# Patient Record
Sex: Female | Born: 1960 | Race: White | Hispanic: No | Marital: Married | Smoking: Never smoker
Health system: Southern US, Community
[De-identification: ages and names within clinical notes are randomized; demographics above are authoritative.]

## PROBLEM LIST (undated history)

## (undated) DIAGNOSIS — I472 Ventricular tachycardia, unspecified: Secondary | ICD-10-CM

## (undated) DIAGNOSIS — F3281 Premenstrual dysphoric disorder: Secondary | ICD-10-CM

## (undated) DIAGNOSIS — E785 Hyperlipidemia, unspecified: Secondary | ICD-10-CM

## (undated) DIAGNOSIS — I2510 Atherosclerotic heart disease of native coronary artery without angina pectoris: Secondary | ICD-10-CM

## (undated) DIAGNOSIS — I213 ST elevation (STEMI) myocardial infarction of unspecified site: Secondary | ICD-10-CM

## (undated) DIAGNOSIS — I5032 Chronic diastolic (congestive) heart failure: Secondary | ICD-10-CM

## (undated) HISTORY — DX: ST elevation (STEMI) myocardial infarction of unspecified site: I21.3

## (undated) HISTORY — DX: Ventricular tachycardia, unspecified: I47.20

## (undated) HISTORY — DX: Atherosclerotic heart disease of native coronary artery without angina pectoris: I25.10

## (undated) HISTORY — PX: OTHER SURGICAL HISTORY: SHX169

## (undated) HISTORY — DX: Premenstrual dysphoric disorder: F32.81

## (undated) HISTORY — DX: Chronic diastolic (congestive) heart failure: I50.32

## (undated) HISTORY — DX: Hyperlipidemia, unspecified: E78.5

---

## 1999-09-14 ENCOUNTER — Encounter: Payer: Self-pay | Admitting: Family Medicine

## 1999-11-02 ENCOUNTER — Other Ambulatory Visit: Admission: RE | Admit: 1999-11-02 | Discharge: 1999-11-02 | Payer: Self-pay | Admitting: Family Medicine

## 2000-12-15 ENCOUNTER — Other Ambulatory Visit: Admission: RE | Admit: 2000-12-15 | Discharge: 2000-12-15 | Payer: Self-pay | Admitting: Family Medicine

## 2002-02-09 ENCOUNTER — Other Ambulatory Visit: Admission: RE | Admit: 2002-02-09 | Discharge: 2002-02-09 | Payer: Self-pay | Admitting: Internal Medicine

## 2003-04-19 ENCOUNTER — Other Ambulatory Visit: Admission: RE | Admit: 2003-04-19 | Discharge: 2003-04-19 | Payer: Self-pay | Admitting: Family Medicine

## 2004-06-11 ENCOUNTER — Other Ambulatory Visit: Admission: RE | Admit: 2004-06-11 | Discharge: 2004-06-11 | Payer: Self-pay | Admitting: Family Medicine

## 2005-08-09 ENCOUNTER — Ambulatory Visit: Payer: Self-pay | Admitting: Family Medicine

## 2005-08-09 ENCOUNTER — Other Ambulatory Visit: Admission: RE | Admit: 2005-08-09 | Discharge: 2005-08-09 | Payer: Self-pay | Admitting: Family Medicine

## 2005-08-09 ENCOUNTER — Encounter: Payer: Self-pay | Admitting: Family Medicine

## 2005-08-12 ENCOUNTER — Ambulatory Visit: Payer: Self-pay | Admitting: Family Medicine

## 2006-12-15 ENCOUNTER — Ambulatory Visit: Payer: Self-pay | Admitting: Family Medicine

## 2006-12-15 ENCOUNTER — Other Ambulatory Visit: Admission: RE | Admit: 2006-12-15 | Discharge: 2006-12-15 | Payer: Self-pay | Admitting: Family Medicine

## 2006-12-15 ENCOUNTER — Encounter: Payer: Self-pay | Admitting: Family Medicine

## 2008-07-24 ENCOUNTER — Encounter: Payer: Self-pay | Admitting: Family Medicine

## 2008-11-13 ENCOUNTER — Encounter: Payer: Self-pay | Admitting: Family Medicine

## 2008-11-13 ENCOUNTER — Ambulatory Visit: Payer: Self-pay | Admitting: Family Medicine

## 2008-11-13 ENCOUNTER — Other Ambulatory Visit: Admission: RE | Admit: 2008-11-13 | Discharge: 2008-11-13 | Payer: Self-pay | Admitting: Family Medicine

## 2008-11-13 DIAGNOSIS — E785 Hyperlipidemia, unspecified: Secondary | ICD-10-CM | POA: Insufficient documentation

## 2008-11-13 DIAGNOSIS — Z8639 Personal history of other endocrine, nutritional and metabolic disease: Secondary | ICD-10-CM | POA: Insufficient documentation

## 2008-11-13 DIAGNOSIS — Z862 Personal history of diseases of the blood and blood-forming organs and certain disorders involving the immune mechanism: Secondary | ICD-10-CM | POA: Insufficient documentation

## 2008-11-20 ENCOUNTER — Encounter (INDEPENDENT_AMBULATORY_CARE_PROVIDER_SITE_OTHER): Payer: Self-pay | Admitting: *Deleted

## 2008-11-26 ENCOUNTER — Ambulatory Visit: Payer: Self-pay | Admitting: Family Medicine

## 2008-11-28 LAB — CONVERTED CEMR LAB
ALT: 17 units/L (ref 0–35)
AST: 18 units/L (ref 0–37)
Albumin: 4 g/dL (ref 3.5–5.2)
Alkaline Phosphatase: 43 units/L (ref 39–117)
BUN: 14 mg/dL (ref 6–23)
Basophils Relative: 0.6 % (ref 0.0–3.0)
CO2: 29 meq/L (ref 19–32)
Chloride: 107 meq/L (ref 96–112)
Eosinophils Relative: 2.3 % (ref 0.0–5.0)
Glucose, Bld: 101 mg/dL — ABNORMAL HIGH (ref 70–99)
HDL: 48.4 mg/dL (ref >39.0)
Monocytes Relative: 6.9 % (ref 3.0–12.0)
Neutrophils Relative %: 65.9 % (ref 43.0–77.0)
Platelets: 190 10*3/uL (ref 150–400)
Potassium: 4.2 meq/L (ref 3.5–5.1)
RBC: 3.91 M/uL (ref 3.87–5.11)
Total CHOL/HDL Ratio: 4.4
Total Protein: 6.9 g/dL (ref 6.0–8.3)
Triglycerides: 64 mg/dL (ref 0–149)
WBC: 6.5 10*3/uL (ref 4.5–10.5)

## 2008-12-11 ENCOUNTER — Ambulatory Visit: Payer: Self-pay | Admitting: Family Medicine

## 2008-12-14 ENCOUNTER — Encounter: Payer: Self-pay | Admitting: Family Medicine

## 2009-07-30 ENCOUNTER — Encounter: Payer: Self-pay | Admitting: Family Medicine

## 2009-08-04 ENCOUNTER — Encounter (INDEPENDENT_AMBULATORY_CARE_PROVIDER_SITE_OTHER): Payer: Self-pay | Admitting: *Deleted

## 2009-10-03 ENCOUNTER — Telehealth: Payer: Self-pay | Admitting: Family Medicine

## 2009-11-11 ENCOUNTER — Telehealth: Payer: Self-pay | Admitting: Family Medicine

## 2009-12-24 ENCOUNTER — Ambulatory Visit: Payer: Self-pay | Admitting: Family Medicine

## 2009-12-24 LAB — CONVERTED CEMR LAB
ALT: 21 U/L
AST: 23 U/L
Cholesterol: 191 mg/dL
HDL: 55.5 mg/dL
LDL Cholesterol: 120 mg/dL — ABNORMAL HIGH
Total CHOL/HDL Ratio: 3
Triglycerides: 80 mg/dL
VLDL: 16 mg/dL

## 2009-12-31 ENCOUNTER — Other Ambulatory Visit: Admission: RE | Admit: 2009-12-31 | Discharge: 2009-12-31 | Payer: Self-pay | Admitting: Family Medicine

## 2009-12-31 ENCOUNTER — Ambulatory Visit: Payer: Self-pay | Admitting: Family Medicine

## 2009-12-31 DIAGNOSIS — N943 Premenstrual tension syndrome: Secondary | ICD-10-CM | POA: Insufficient documentation

## 2010-01-07 ENCOUNTER — Encounter (INDEPENDENT_AMBULATORY_CARE_PROVIDER_SITE_OTHER): Payer: Self-pay | Admitting: *Deleted

## 2010-02-18 ENCOUNTER — Telehealth: Payer: Self-pay | Admitting: Family Medicine

## 2010-12-15 NOTE — Letter (Signed)
Summary: Results Follow up Letter  [place] [facility]  [address] [place], [place] [address]   Phone: [phone]  Fax: [phone]    01/07/2010 MRN: [id number]    Leslie Shepard [address] [place], [place]  [address]    Dear Ms. [name],  The following are the results of your recent test(s):  Test         Result    Pap Smear:        Normal __X___  Not Normal _____ Comments: ______________________________________________________ Cholesterol: LDL(Bad cholesterol):         Your goal is less than:         HDL (Good cholesterol):       Your goal is more than: Comments:  ______________________________________________________ Mammogram:        Normal _____  Not Normal _____ Comments:  ___________________________________________________________________ Hemoccult:        Normal _____  Not normal _______ Comments:    _____________________________________________________________________ Other Tests:    We routinely do not discuss normal results over the telephone.  If you desire a copy of the results, or you have any questions about this information we can discuss them at your next office visit.   Sincerely,    Marne A. Milinda Antis, M.D.  MAT:lsf

## 2010-12-15 NOTE — Assessment & Plan Note (Signed)
Summary: PAP SMEAR AND CPX/CLE   Vital Signs:  Patient profile:   50 year old female Height:      64 inches Weight:      145 pounds BMI:     24.98 Temp:     98 degrees F oral Pulse rate:   60 / minute Pulse rhythm:   regular BP sitting:   116 / 70  (left arm) Cuff size:   regular  Vitals Entered By: Lewanda Rife LPN (December 31, 2009 10:42 AM)  History of Present Illness: here for exam and pap  is doing well and feeling good   no concerns or problems   getting ready for spring  wants to get started back with exercise  is interested in zumba   cholesterol is good -- LDL is 120s -- much imp / wants to stay at same dose diet is very good  no side eff is thirsty at night   Td up to date no flu shots   nl mam in fall  self exam- no new lumps or changes  periods are regular -- shorter cycle sometimes 25 days  bad PMDD -- irrational anger  is interested in sarafem       Allergies (verified): No Known Drug Allergies  Past History:  Family History: Last updated: 12/31/2009 Mother- High cholesterol Father- epilepsy M 1/2 Aunt Breast Ca? Grandparents- Depression sister- carotid stenosis (smoker), high chol sister- bulemia sister cholesterol  Social History: Last updated: 11/13/2008 Never Smoked Alcohol use-yes/ occasional Drug use-no exercise- running/ biking   Risk Factors: Smoking Status: never (11/13/2008)  Past Medical History: (2006) Thyroiditis Hyperlipidemia PMDD   endocrine- Dr Talmage Nap (past)  Family History: Mother- High cholesterol Father- epilepsy M 1/2 Aunt Breast Ca? Grandparents- Depression sister- carotid stenosis (smoker), high chol sister- bulemia sister cholesterol  Review of Systems General:  Denies fatigue, fever, loss of appetite, and malaise. Eyes:  Denies blurring and eye irritation. CV:  Denies chest pain or discomfort and lightheadness. Resp:  Denies cough and shortness of breath. GI:  Denies abdominal pain,  bloody stools, change in bowel habits, and indigestion. GU:  Denies abnormal vaginal bleeding, decreased libido, dysuria, and hematuria. MS:  Denies joint pain, joint redness, joint swelling, and muscle aches. Derm:  Denies itching, lesion(s), poor wound healing, and rash. Neuro:  Denies numbness and tingling. Psych:  mood is ok . Endo:  Denies excessive thirst and excessive urination. Heme:  Denies abnormal bruising and bleeding.  Physical Exam  General:  Well-developed,well-nourished,in no acute distress; alert,appropriate and cooperative throughout examination Head:  normocephalic, atraumatic, and no abnormalities observed.   Eyes:  vision grossly intact, pupils equal, pupils round, and pupils reactive to light.   Ears:  R ear normal and L ear normal.   Nose:  no nasal discharge.   Mouth:  pharynx pink and moist.   Neck:  supple with full rom and no masses or thyromegally, no JVD or carotid bruit  Chest Wall:  No deformities, masses, or tenderness noted. Breasts:  No mass, nodules, thickening, tenderness, bulging, retraction, inflamation, nipple discharge or skin changes noted.   Lungs:  Normal respiratory effort, chest expands symmetrically. Lungs are clear to auscultation, no crackles or wheezes. Heart:  Normal rate and regular rhythm. S1 and S2 normal without gallop, murmur, click, rub or other extra sounds. Abdomen:  Bowel sounds positive,abdomen soft and non-tender without masses, organomegaly or hernias noted. no renal bruits  Genitalia:  Normal introitus for age, no external lesions, no vaginal  discharge, mucosa pink and moist, no vaginal or cervical lesions, no vaginal atrophy, no friaility or hemorrhage, normal uterus size and position, no adnexal masses or tenderness Msk:  No deformity or scoliosis noted of thoracic or lumbar spine.  no acute joint changes Pulses:  R and L carotid,radial,femoral,dorsalis pedis and posterior tibial pulses are full and equal  bilaterally Extremities:  No clubbing, cyanosis, edema, or deformity noted with normal full range of motion of all joints.   Neurologic:  sensation intact to light touch, gait normal, and DTRs symmetrical and normal.   Skin:  Intact without suspicious lesions or rashes Cervical Nodes:  No lymphadenopathy noted Axillary Nodes:  No palpable lymphadenopathy Inguinal Nodes:  No significant adenopathy Psych:  normal affect, talkative and pleasant    Impression & Recommendations:  Problem # 1:  HEALTH MAINTENANCE EXAM (ICD-V70.0) Assessment Comment Only reviewed health habits including diet, exercise and skin cancer prevention reviewed health maintenance list and family history lab in 6 mo   Problem # 2:  ROUTINE GYNECOLOGICAL EXAMINATION (ICD-V72.31) Assessment: Comment Only annual exam  trial of sarafem for pmdd- disc poss side eff in detail  Problem # 3:  HYPERLIPIDEMIA (ICD-272.4) Assessment: Improved  impwith zocor and good diet rev lab re check 6 mo  rev low sat fat diet Her updated medication list for this problem includes:    Zocor 10 Mg Tabs (Simvastatin) .Marland Kitchen... 1 by mouth once daily  Labs Reviewed: SGOT: 23 (12/24/2009)   SGPT: 21 (12/24/2009)   HDL:55.50 (12/24/2009), 48.4 (11/26/2008)  LDL:120 (12/24/2009), DEL (11/26/2008)  Chol:191 (12/24/2009), 212 (11/26/2008)  Trig:80.0 (12/24/2009), 64 (11/26/2008)  Problem # 4:  PREMENSTRUAL DYSPHORIC SYNDROME (ICD-625.4) Assessment: New trial of sarafem around time of menses symptoms are disabling and life eff at times  disc pos side eff- if worse anx or dep will stop it and call  Complete Medication List: 1)  Zocor 10 Mg Tabs (Simvastatin) .Marland Kitchen.. 1 by mouth once daily 2)  Sarafem 10 Mg Tabs (Fluoxetine hcl (pmdd)) .... Take 1 by mouth once daily for 10 days around menses  Patient Instructions: 1)  schedule fasting labs in 6 months lipid/ast/alt/tsh/ cbc with diff v70.0, 272  2)  keep working on healthy diet and  exercise 3)  try sarafem for pre menstrual dysphoric syndrome  Prescriptions: ZOCOR 10 MG TABS (SIMVASTATIN) 1 by mouth once daily  #30 x 11   Entered and Authorized by:   Judith Part MD   Signed by:   Judith Part MD on 12/31/2009   Method used:   Print then Give to Patient   RxID:   [id number] SARAFEM 10 MG TABS (FLUOXETINE HCL (PMDD)) take 1 by mouth once daily for 10 days around menses  #10 x 11   Entered and Authorized by:   Judith Part MD   Signed by:   Judith Part MD on 12/31/2009   Method used:   Print then Give to Patient   RxID:   [id number]   Current Allergies (reviewed today): No known allergies    Past Medical History:    (2006) Thyroiditis    Hyperlipidemia    PMDD            endocrine- Dr Talmage Nap (past)

## 2010-12-15 NOTE — Progress Notes (Signed)
Summary: Joint pain  Phone Note Call from Patient Call back at [phone]   Caller: Patient Call For: Judith Part MD Summary of Call: Patient says she started Zocor in November 2010 and now she has developed serve joint pain.  Elbow, hip, and ankles pain.  Legs feel like they are "heavy."  She says she has been doing some reading about Zocor and it does take about joint pain being one of the side effects.  Please advise. Initial call taken by: Linde Gillis CMA Duncan Dull),  February 18, 2010 11:24 AM  Follow-up for Phone Call        stop the zocor  update me by phone in 2 weeks re: how pain is  Follow-up by: Judith Part MD,  February 18, 2010 12:08 PM  Additional Follow-up for Phone Call Additional follow up Details #1::        Left message on voicemail  to return call. Delilah Shan CMA Duncan Dull)  February 18, 2010 2:17 PM   Patient Advised. Lugene Fuquay CMA (AAMA)  February 18, 2010 2:52 PM     New/Updated Medications: ZOCOR 10 MG TABS (SIMVASTATIN) 1 by mouth once daily (holding for joint pain 4/11)

## 2011-04-13 ENCOUNTER — Encounter: Payer: Self-pay | Admitting: Family Medicine

## 2011-04-13 ENCOUNTER — Ambulatory Visit (INDEPENDENT_AMBULATORY_CARE_PROVIDER_SITE_OTHER): Payer: BC Managed Care – PPO | Admitting: Family Medicine

## 2011-04-13 VITALS — BP 100/60 | HR 64 | Temp 98.7°F | Resp 18 | Wt 152.0 lb

## 2011-04-13 DIAGNOSIS — L237 Allergic contact dermatitis due to plants, except food: Secondary | ICD-10-CM

## 2011-04-13 DIAGNOSIS — L255 Unspecified contact dermatitis due to plants, except food: Secondary | ICD-10-CM

## 2011-04-13 MED ORDER — PREDNISONE 10 MG PO TABS: ORAL_TABLET | ORAL | Status: DC

## 2011-04-13 NOTE — Progress Notes (Signed)
Poison ivy.  Exposure Friday.  Itching.  On R arm/hand, B feet, and on B face near the eyes.  No FCNAVD.  Took some zyrtec, minimal change.    Feeling well o/w.  Meds, vitals, and allergies reviewed.   ROS: See HPI.  Otherwise, noncontributory.  ncat Mmm ctab rrr Skin with vesicular lesions on the hands.  Smaller red papules noted on the arms, feet and face.  B rash, irregular distribution, spares the trunk.  No purulent material noted.

## 2011-04-13 NOTE — Patient Instructions (Signed)
Take the prednisone as directed with food.  Let us know if you have other concerns.  I would continue the zyrtec in the meantime.

## 2011-04-14 ENCOUNTER — Encounter: Payer: Self-pay | Admitting: Family Medicine

## 2011-04-14 NOTE — Assessment & Plan Note (Signed)
D/w pt JX:BJYNWGN.  With rash on face and ext x4, I would proceed with steroid taper.  GI/steroid caution and fu prn.  She agrees.

## 2013-02-16 ENCOUNTER — Other Ambulatory Visit (HOSPITAL_COMMUNITY)
Admission: RE | Admit: 2013-02-16 | Discharge: 2013-02-16 | Disposition: A | Discharge status: Home / Self Care | Payer: BC Managed Care – PPO | Source: Ambulatory Visit | Attending: Family Medicine | Admitting: Family Medicine

## 2013-02-16 ENCOUNTER — Ambulatory Visit (INDEPENDENT_AMBULATORY_CARE_PROVIDER_SITE_OTHER): Payer: BC Managed Care – PPO | Admitting: Family Medicine

## 2013-02-16 ENCOUNTER — Encounter: Payer: Self-pay | Admitting: Family Medicine

## 2013-02-16 VITALS — BP 120/70 | HR 56 | Temp 97.8°F | Ht 64.5 in | Wt 157.5 lb

## 2013-02-16 DIAGNOSIS — Z23 Encounter for immunization: Secondary | ICD-10-CM

## 2013-02-16 DIAGNOSIS — Z1151 Encounter for screening for human papillomavirus (HPV): Secondary | ICD-10-CM | POA: Insufficient documentation

## 2013-02-16 DIAGNOSIS — Z01419 Encounter for gynecological examination (general) (routine) without abnormal findings: Secondary | ICD-10-CM | POA: Insufficient documentation

## 2013-02-16 DIAGNOSIS — Z0000 Encounter for general adult medical examination without abnormal findings: Secondary | ICD-10-CM

## 2013-02-16 DIAGNOSIS — E785 Hyperlipidemia, unspecified: Secondary | ICD-10-CM

## 2013-02-16 LAB — COMPREHENSIVE METABOLIC PANEL
AST: 20 U/L (ref 0–37)
Albumin: 3.7 g/dL (ref 3.5–5.2)
Alkaline Phosphatase: 54 U/L (ref 39–117)
BUN: 11 mg/dL (ref 6–23)
Potassium: 3.8 mEq/L (ref 3.5–5.1)
Sodium: 135 mEq/L (ref 135–145)
Total Bilirubin: 0.5 mg/dL (ref 0.3–1.2)
Total Protein: 7 g/dL (ref 6.0–8.3)

## 2013-02-16 LAB — CBC WITH DIFFERENTIAL/PLATELET
Eosinophils Absolute: 0.1 10*3/uL (ref 0.0–0.7)
Lymphs Abs: 2 10*3/uL (ref 0.7–4.0)
MCHC: 33.4 g/dL (ref 30.0–36.0)
MCV: 89.1 fl (ref 78.0–100.0)
Monocytes Absolute: 0.4 10*3/uL (ref 0.1–1.0)
Neutrophils Relative %: 60.6 % (ref 43.0–77.0)
Platelets: 225 10*3/uL (ref 150.0–400.0)
RDW: 13.9 % (ref 11.5–14.6)

## 2013-02-16 LAB — LDL CHOLESTEROL, DIRECT: Direct LDL: 189.3 mg/dL

## 2013-02-16 LAB — LIPID PANEL
Total CHOL/HDL Ratio: 4
Triglycerides: 124 mg/dL (ref 0.0–149.0)

## 2013-02-16 NOTE — Assessment & Plan Note (Addendum)
Small amt of menstrual spotting today Routine exam with pap done Disc expectation for menopause

## 2013-02-16 NOTE — Patient Instructions (Addendum)
When you are ready to schedule a mammogram- I gave you the phone number to schedule Keep doing self breast exams Avoid red meat/ fried foods/ egg yolks/ fatty breakfast meats/ butter, cheese and high fat dairy/ and shellfish   Try to exercise regularly  Pap done today

## 2013-02-16 NOTE — Progress Notes (Signed)
Subjective:    Patient ID: Leslie Shepard, female    DOB: [birth year], 52 y.o.   MRN: [id number]  HPI Here for health maintenance exam and to review chronic medical problems    Wt is up 5 lb today with bmi of 26 Is doing great overall    Needs lab today- will do that   Td 2002  mammo 9/10- she is not ready to get this yet  Self exam  - no lumps or changes   Colon cancer screen- not interested in colonoscopy yet Declines IFOB also   Pap 2/11 normal  Periods are very irregular and hard to predict - at times is heavy  Is spotting today   Flu vaccine -did not get   Hyperlipidemia  Just did not tolerate statin so she stopped it - feels much better    Patient Active Problem List  Diagnosis  . HYPERLIPIDEMIA  . PREMENSTRUAL DYSPHORIC SYNDROME  . THYROIDITIS, HX OF  . Routine general medical examination at a health care facility   Past Medical History  Diagnosis Date  . Thyroiditis 2006    Endocrine-Dr. Talmage Nap (past)  . Hyperlipidemia   . PMDD (premenstrual dysphoric disorder)    No past surgical history on file. History  Substance Use Topics  . Smoking status: Never Smoker   . Smokeless tobacco: Not on file  . Alcohol Use: Yes     Comment: occasional   Family History  Problem Relation Age of Onset  . Hyperlipidemia Mother   . Seizures Father     epilepsy  . Breast cancer Maternal Aunt     M 1/2 Aunt ?  . Depression      grandparents  . Other Sister     carotid stenosis, smoker  . Hyperlipidemia Sister   . Bulemia Sister    No Known Allergies No current outpatient prescriptions on file prior to visit.   No current facility-administered medications on file prior to visit.      Review of Systems Review of Systems  Constitutional: Negative for fever, appetite change, fatigue and unexpected weight change.  Eyes: Negative for pain and visual disturbance.  Respiratory: Negative for cough and shortness of breath.   Cardiovascular: Negative for cp or  palpitations    Gastrointestinal: Negative for nausea, diarrhea and constipation.  Genitourinary: Negative for urgency and frequency. pos for irregular menses  Skin: Negative for pallor or rash   Neurological: Negative for weakness, light-headedness, numbness and headaches.  Hematological: Negative for adenopathy. Does not bruise/bleed easily.  Psychiatric/Behavioral: Negative for dysphoric mood. The patient is not nervous/anxious.         Objective:   Physical Exam  Constitutional: She appears well-developed and well-nourished. No distress.  HENT:  Head: Normocephalic and atraumatic.  Right Ear: External ear normal.  Left Ear: External ear normal.  Nose: Nose normal.  Mouth/Throat: Oropharynx is clear and moist.  Eyes: Conjunctivae and EOM are normal. Right eye exhibits no discharge. Left eye exhibits no discharge. No scleral icterus.  Neck: Normal range of motion. Neck supple. No JVD present. Carotid bruit is not present. No thyromegaly present.  Cardiovascular: Normal rate, regular rhythm, normal heart sounds and intact distal pulses.  Exam reveals no gallop.   Pulmonary/Chest: Effort normal and breath sounds normal. No respiratory distress. She has no wheezes. She has no rales.  Abdominal: Soft. Bowel sounds are normal. She exhibits no distension, no abdominal bruit and no mass. There is no tenderness.  Genitourinary: Rectum normal and uterus  normal. No breast swelling, tenderness, discharge or bleeding. There is no rash, tenderness or lesion on the right labia. There is no rash, tenderness or lesion on the left labia. Uterus is not enlarged and not tender. Cervix exhibits no motion tenderness and no friability. Right adnexum displays no mass, no tenderness and no fullness. Left adnexum displays no mass, no tenderness and no fullness. There is bleeding around the vagina. No vaginal discharge found.  Scant blood at cervical os   Breast exam: No mass, nodules, thickening, tenderness,  bulging, retraction, inflamation, nipple discharge or skin changes noted.  No axillary or clavicular LA.  Chaperoned exam.    Musculoskeletal: She exhibits no edema and no tenderness.  Lymphadenopathy:    She has no cervical adenopathy.  Neurological: She is alert. She has normal reflexes. No cranial nerve deficit. She exhibits normal muscle tone. Coordination normal.  Skin: Skin is warm and dry. No rash noted. No erythema. No pallor.  Psychiatric: She has a normal mood and affect.          Assessment & Plan:

## 2013-02-18 NOTE — Assessment & Plan Note (Signed)
Reviewed health habits including diet and exercise and skin cancer prevention Also reviewed health mt list, fam hx and immunizations  Wellness lab today   

## 2013-02-18 NOTE — Assessment & Plan Note (Signed)
Pt intol of statin and choose not to continue any type of cholesterol tx Is trying to control with diet and exercise

## 2013-02-19 ENCOUNTER — Encounter: Payer: Self-pay | Admitting: *Deleted

## 2013-02-21 ENCOUNTER — Encounter: Payer: Self-pay | Admitting: *Deleted

## 2013-04-17 ENCOUNTER — Other Ambulatory Visit (INDEPENDENT_AMBULATORY_CARE_PROVIDER_SITE_OTHER): Payer: BC Managed Care – PPO

## 2013-04-17 ENCOUNTER — Telehealth: Payer: Self-pay | Admitting: Family Medicine

## 2013-04-17 DIAGNOSIS — N920 Excessive and frequent menstruation with regular cycle: Secondary | ICD-10-CM

## 2013-04-17 LAB — CBC WITH DIFFERENTIAL/PLATELET
Basophils Absolute: 0 10*3/uL (ref 0.0–0.1)
Hemoglobin: 9.2 g/dL — ABNORMAL LOW (ref 12.0–15.0)
Lymphocytes Relative: 32.8 % (ref 12.0–46.0)
Monocytes Relative: 6 % (ref 3.0–12.0)
Neutro Abs: 4 10*3/uL (ref 1.4–7.7)
RBC: 3 Mil/uL — ABNORMAL LOW (ref 3.87–5.11)
RDW: 13.5 % (ref 11.5–14.6)

## 2013-04-17 MED ORDER — POLYSACCHARIDE IRON COMPLEX 150 MG PO CAPS: 150.0000 mg | ORAL_CAPSULE | Freq: Two times a day (BID) | ORAL | Status: DC

## 2013-04-17 NOTE — Telephone Encounter (Signed)
Pt notified of Dr. Royden Purl recommendation, lab appt scheduled for today, pt wants to wait until we get her labs back before she schedules a f/u appt, Dr. Milinda Antis notified and is okay with this plan

## 2013-04-17 NOTE — Telephone Encounter (Signed)
Hemoglobin is low enough to cause fatigue-but not critical Please call in nu iron 150 bid and tell her to use a stool softener as needed if she gets constipated  Follow up with me in a week or so but update sooner if bleeding starts again

## 2013-04-17 NOTE — Telephone Encounter (Signed)
Patient advised. Medication phoned to pharmacy. Patient says she started bleeding again today and it "gushes" when she goes to the bathroom.  No follow up appt was made at this time in order to instruct MD for update on  bleeding starting again.

## 2013-04-17 NOTE — Telephone Encounter (Signed)
Patient Information:  Caller Name: [place]  Phone: [phone]  Patient: Leslie Shepard, Leslie Shepard  Gender: Female  DOB: Apr 13, 1961  Age: 52 Years  PCP: Roxy Manns [facility])  Pregnant: No  Office Follow Up:  Does the office need to follow up with this patient?: Yes  Instructions For The Office: OFFICE CAN PT BE SEEN AT THE OFFICE WITH DR Milinda Antis.  PLEASE FOLLOW UP WITH PT  RN Note:  pt reports that she is having a lot of clots.  Pt is currently not bleeding right now (bleeding stopped on 04/14/13)  Symptoms  Reason For Call & Symptoms: vaginal bleeding; caller reports she has had bleeding for 6 weeks with a week break in between.  Caller reports her bleeding is heavy and she is going thru a tampon every hour at times then it will level off.  Caller is feeling "wiped out" and more tired.  Caller states this is not normal for her and she is worried about anemia  Reviewed Health History In EMR: Yes  Reviewed Medications In EMR: Yes  Reviewed Allergies In EMR: Yes  Reviewed Surgeries / Procedures: Yes  Date of Onset of Symptoms: 04/17/2013 OB / GYN:  LMP: Unknown  Guideline(s) Used:  Vaginal Bleeding - Abnormal  Disposition Per Guideline:   Go to ED Now (or to Office with PCP Approval)  Reason For Disposition Reached:   Pale skin (pallor) of new onset or worsening  Advice Given:  N/A  Patient Will Follow Care Advice:  YES

## 2013-04-17 NOTE — Telephone Encounter (Signed)
Schedule her for labs today for dx of heavy menses cbc with diff please - run it stat - I will get back to her about those asap  Follow up later in the week and let me know if bleeding resumes in the meantime

## 2013-04-18 DIAGNOSIS — N920 Excessive and frequent menstruation with regular cycle: Secondary | ICD-10-CM | POA: Insufficient documentation

## 2013-04-18 NOTE — Telephone Encounter (Signed)
I want to refer her to gyn asap  Tell her Shirlee Limerick will call - and if symptoms worsen- go to ER

## 2013-04-18 NOTE — Telephone Encounter (Signed)
Pt advise Leslie Shepard will call to set up gyn referral and to go to ER if sxs worsen, pt said the bleeding has stopped for now but does want to see a gyn

## 2013-04-19 ENCOUNTER — Telehealth: Payer: Self-pay | Admitting: Family Medicine

## 2013-04-19 NOTE — Telephone Encounter (Signed)
Patient called to tell you that she saw Dr Luella Cook today and he did a Biopsy. She didn't like him at all as he was very short with her.  She asked [place] to refer her to another GYN and wanted a female. I told her about Dr Renaldo Fiddler and we made her an appt for July 2nd. She is supposed to go back to Dr Luella Cook on June 19th but doesn't want to go so she will cancel. I told her to ask them to send you copies of the biopsy report and office visit and then we can forward to Dr Renaldo Fiddler.

## 2013-04-20 NOTE — Telephone Encounter (Signed)
Thanks, she will really like Dr Renaldo Fiddler

## 2013-05-21 ENCOUNTER — Encounter: Payer: Self-pay | Admitting: Family Medicine

## 2013-06-08 ENCOUNTER — Ambulatory Visit (INDEPENDENT_AMBULATORY_CARE_PROVIDER_SITE_OTHER)
Admission: RE | Admit: 2013-06-08 | Discharge: 2013-06-08 | Disposition: A | Discharge status: Home / Self Care | Payer: BC Managed Care – PPO | Source: Ambulatory Visit | Attending: Family Medicine | Admitting: Family Medicine

## 2013-06-08 ENCOUNTER — Ambulatory Visit (INDEPENDENT_AMBULATORY_CARE_PROVIDER_SITE_OTHER): Payer: BC Managed Care – PPO | Admitting: Family Medicine

## 2013-06-08 ENCOUNTER — Encounter: Payer: Self-pay | Admitting: Family Medicine

## 2013-06-08 VITALS — BP 130/78 | HR 76 | Temp 98.1°F | Ht 64.5 in | Wt 159.2 lb

## 2013-06-08 DIAGNOSIS — M549 Dorsalgia, unspecified: Secondary | ICD-10-CM

## 2013-06-08 DIAGNOSIS — R202 Paresthesia of skin: Secondary | ICD-10-CM | POA: Insufficient documentation

## 2013-06-08 DIAGNOSIS — R209 Unspecified disturbances of skin sensation: Secondary | ICD-10-CM

## 2013-06-08 DIAGNOSIS — M545 Low back pain, unspecified: Secondary | ICD-10-CM

## 2013-06-08 NOTE — Progress Notes (Signed)
Subjective:    Patient ID: Leslie Shepard, female    DOB: 28-Oct-1961, 52 y.o.   MRN: [id number]  HPI Here with back pain  Has had trouble for years -since her 30s -low back pain after heavy lifting in a job  After July 4th Ran 5 miles  Then she developed a funny feeling in her L leg - posterior mid thigh to ankle  The next day - she felt like her leg was going to "give out" on her - unstable but not really weak  Now back pain is improved  Numbness is better  Discomfort - over medial leg/ knee -but not loss of strength  She saw PT yesterday- thought it correlated to L3 issue  Was told to work on strengthening her core-and she is excited to get a trainer  Thinks running may no longer be her thing   Patient Active Problem List   Diagnosis Date Noted  . Heavy menses 04/18/2013  . Routine general medical examination at a health care facility 02/16/2013  . Encounter for routine gynecological examination 02/16/2013  . PREMENSTRUAL DYSPHORIC SYNDROME 12/31/2009  . HYPERLIPIDEMIA 11/13/2008  . THYROIDITIS, HX OF 11/13/2008   Past Medical History  Diagnosis Date  . Thyroiditis 2006    Endocrine-Dr. Talmage Nap (past)  . Hyperlipidemia   . PMDD (premenstrual dysphoric disorder)    No past surgical history on file. History  Substance Use Topics  . Smoking status: Never Smoker   . Smokeless tobacco: Not on file  . Alcohol Use: Yes     Comment: occasional   Family History  Problem Relation Age of Onset  . Hyperlipidemia Mother   . Seizures Father     epilepsy  . Breast cancer Maternal Aunt     M 1/2 Aunt ?  . Depression      grandparents  . Other Sister     carotid stenosis, smoker  . Hyperlipidemia Sister   . Bulemia Sister    Allergies  Allergen Reactions  . Statins     Malaise and muscle pain    Current Outpatient Prescriptions on File Prior to Visit  Medication Sig Dispense Refill  . iron polysaccharides (NU-IRON) 150 MG capsule Take 1 capsule (150 mg total) by mouth  2 (two) times daily.  60 capsule  1   No current facility-administered medications on file prior to visit.      Review of Systems Review of Systems  Constitutional: Negative for fever, appetite change, fatigue and unexpected weight change.  Eyes: Negative for pain and visual disturbance.  Respiratory: Negative for cough and shortness of breath.   Cardiovascular: Negative for cp or palpitations    Gastrointestinal: Negative for nausea, diarrhea and constipation.  Genitourinary: Negative for urgency and frequency.  Skin: Negative for pallor or rash   MSK pos for intermittent back pain , neg for joint swelling  Neurological: Negative for weakness, light-headedness, and headaches.  Hematological: Negative for adenopathy. Does not bruise/bleed easily.  Psychiatric/Behavioral: Negative for dysphoric mood. The patient is not nervous/anxious.         Objective:   Physical Exam  Constitutional: She appears well-developed and well-nourished. No distress.  Neck: Normal range of motion. Neck supple.  Cardiovascular: Normal rate and regular rhythm.   Pulmonary/Chest: Effort normal and breath sounds normal.  Abdominal: Soft. Bowel sounds are normal. There is no tenderness.  Musculoskeletal:  Loss of lordosis noted No spinous tenderness  Some mild L SI joint muscular tenderness Nl rom hip and knee  Nl SLR and ROM spine  Lymphadenopathy:    She has no cervical adenopathy.  Neurological: She is alert. She has normal reflexes. She displays no atrophy. No cranial nerve deficit. She exhibits normal muscle tone. Coordination and gait normal.  slt dec to soft touch sens medial knee on L  Skin: Skin is warm and dry. No rash noted. No erythema. No pallor.  Psychiatric: She has a normal mood and affect.          Assessment & Plan:

## 2013-06-08 NOTE — Patient Instructions (Addendum)
Xray today  If you have pain - ibuprofen or aleve with food is ok to calm inflammation Try some heat on low back for 10 minutes at time If you get worse let me know

## 2013-06-10 NOTE — Assessment & Plan Note (Signed)
Very slight - suspect may be radiculopathy from back problem Xray today

## 2013-06-10 NOTE — Assessment & Plan Note (Signed)
Left sided with some paresthesia of the medial L leg and no change in strength or gait Reassuring exam Xray today  Disc nsaid for pain and inflammation  Update if not starting to improve in a week or if worsening

## 2013-06-12 ENCOUNTER — Ambulatory Visit (INDEPENDENT_AMBULATORY_CARE_PROVIDER_SITE_OTHER): Payer: BC Managed Care – PPO | Admitting: Family Medicine

## 2013-06-12 ENCOUNTER — Encounter: Payer: Self-pay | Admitting: Family Medicine

## 2013-06-12 VITALS — BP 132/82 | HR 83 | Temp 98.1°F | Ht 64.5 in | Wt 159.8 lb

## 2013-06-12 DIAGNOSIS — M545 Low back pain, unspecified: Secondary | ICD-10-CM

## 2013-06-12 DIAGNOSIS — N390 Urinary tract infection, site not specified: Secondary | ICD-10-CM

## 2013-06-12 LAB — POCT UA - MICROSCOPIC ONLY

## 2013-06-12 LAB — POCT URINALYSIS DIPSTICK
Spec Grav, UA: 1.015
Urobilinogen, UA: 0.2

## 2013-06-12 MED ORDER — CIPROFLOXACIN HCL 250 MG PO TABS: 250.0000 mg | ORAL_TABLET | Freq: Two times a day (BID) | ORAL | Status: DC

## 2013-06-12 NOTE — Progress Notes (Signed)
  Subjective:    Patient ID: Leslie Shepard, female    DOB: 14-Jul-1961, 52 y.o.   MRN: [id number]  HPI Here with uti symptoms - had some symptoms over the weekend  Had some frequency last week  Painful/ burning to urinate , and also bladder pressure /pain -- no back or flank pain  No fever or n/v Feels bad in general  Urgency  ? If blood in urine (hard to tell due to menses)   Patient Active Problem List   Diagnosis Date Noted  . UTI (urinary tract infection) 06/12/2013  . Low back pain 06/08/2013  . Paresthesia of left leg 06/08/2013  . Heavy menses 04/18/2013  . Routine general medical examination at a health care facility 02/16/2013  . Encounter for routine gynecological examination 02/16/2013  . PREMENSTRUAL DYSPHORIC SYNDROME 12/31/2009  . HYPERLIPIDEMIA 11/13/2008  . THYROIDITIS, HX OF 11/13/2008   Past Medical History  Diagnosis Date  . Thyroiditis 2006    Endocrine-Dr. Talmage Nap (past)  . Hyperlipidemia   . PMDD (premenstrual dysphoric disorder)    No past surgical history on file. History  Substance Use Topics  . Smoking status: Never Smoker   . Smokeless tobacco: Not on file  . Alcohol Use: Yes     Comment: occasional   Family History  Problem Relation Age of Onset  . Hyperlipidemia Mother   . Seizures Father     epilepsy  . Breast cancer Maternal Aunt     M 1/2 Aunt ?  . Depression      grandparents  . Other Sister     carotid stenosis, smoker  . Hyperlipidemia Sister   . Bulemia Sister    Allergies  Allergen Reactions  . Statins     Malaise and muscle pain    Current Outpatient Prescriptions on File Prior to Visit  Medication Sig Dispense Refill  . iron polysaccharides (NU-IRON) 150 MG capsule Take 1 capsule (150 mg total) by mouth 2 (two) times daily.  60 capsule  1   No current facility-administered medications on file prior to visit.     Review of Systems Review of Systems  Constitutional: Negative for fever, appetite change, fatigue and  unexpected weight change.  Eyes: Negative for pain and visual disturbance.  Respiratory: Negative for cough and shortness of breath.   Cardiovascular: Negative for cp or palpitations    Gastrointestinal: Negative for nausea, diarrhea and constipation.  Genitourinary: pos for urgency and frequency. neg for flank pain  Skin: Negative for pallor or rash   Neurological: Negative for weakness, light-headedness, numbness and headaches.  Hematological: Negative for adenopathy. Does not bruise/bleed easily.  Psychiatric/Behavioral: Negative for dysphoric mood. The patient is not nervous/anxious.         Objective:   Physical Exam  Constitutional: She appears well-developed and well-nourished. No distress.  HENT:  Head: Normocephalic and atraumatic.  Eyes: Conjunctivae and EOM are normal. Pupils are equal, round, and reactive to light.  Neck: Normal range of motion.  Cardiovascular: Normal rate and regular rhythm.   Abdominal: Soft. Bowel sounds are normal. She exhibits no distension. There is tenderness in the suprapubic area. There is no CVA tenderness.  Lymphadenopathy:    She has no cervical adenopathy.  Neurological: She is alert.  Skin: Skin is warm and dry. No rash noted.  Psychiatric: She has a normal mood and affect.          Assessment & Plan:

## 2013-06-12 NOTE — Assessment & Plan Note (Signed)
Uncomplicated tx with 5 days of cipro Enc fluid intake  Urine cx pending

## 2013-06-12 NOTE — Patient Instructions (Addendum)
Drink lots of water  Take cipro twice daily as directed  If symptoms worsen or do not improve in 3-4 days let me know  We will culture your urine and get you result

## 2013-06-15 LAB — URINE CULTURE

## 2013-09-20 ENCOUNTER — Other Ambulatory Visit: Payer: Self-pay

## 2014-08-18 IMAGING — CR DG LUMBAR SPINE COMPLETE 4+V
5 series · 5 of 5 positions shown · non-contrast
Comparison: None.

CLINICAL DATA: Low back pain with radicular symptoms

LUMBAR SPINE - COMPLETE 4+ VIEW

[view not recorded (1 of 5)]
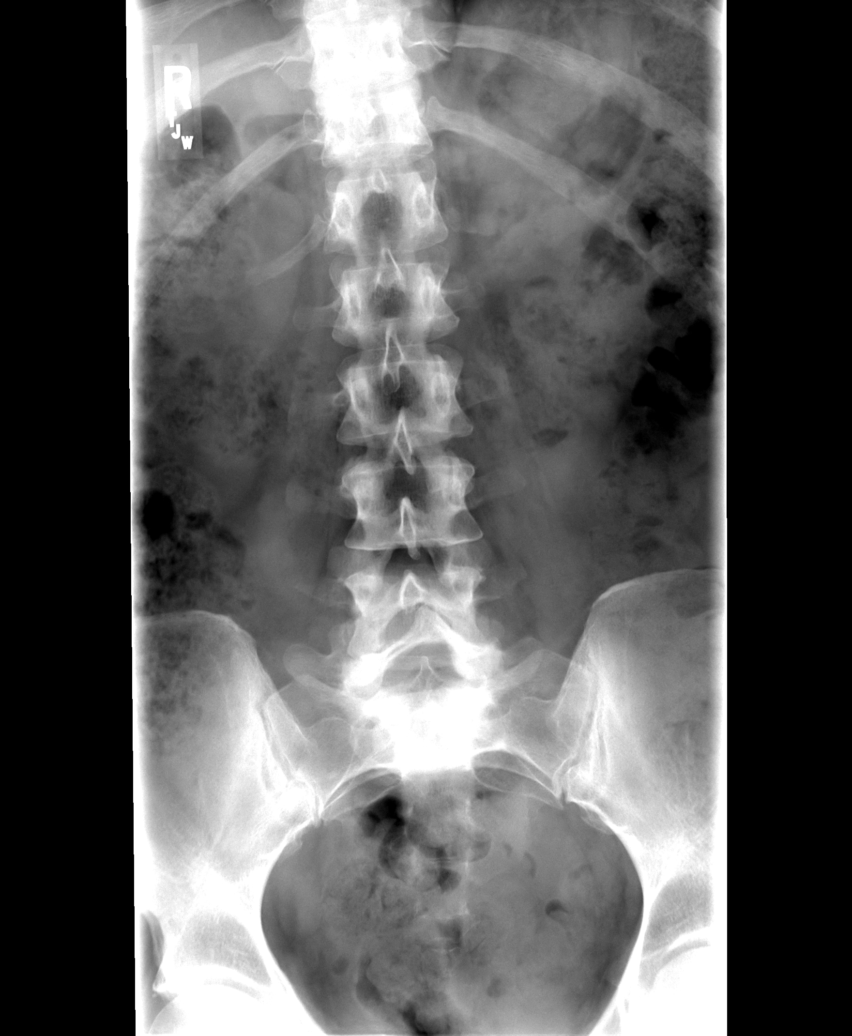

[view not recorded (2 of 5)]
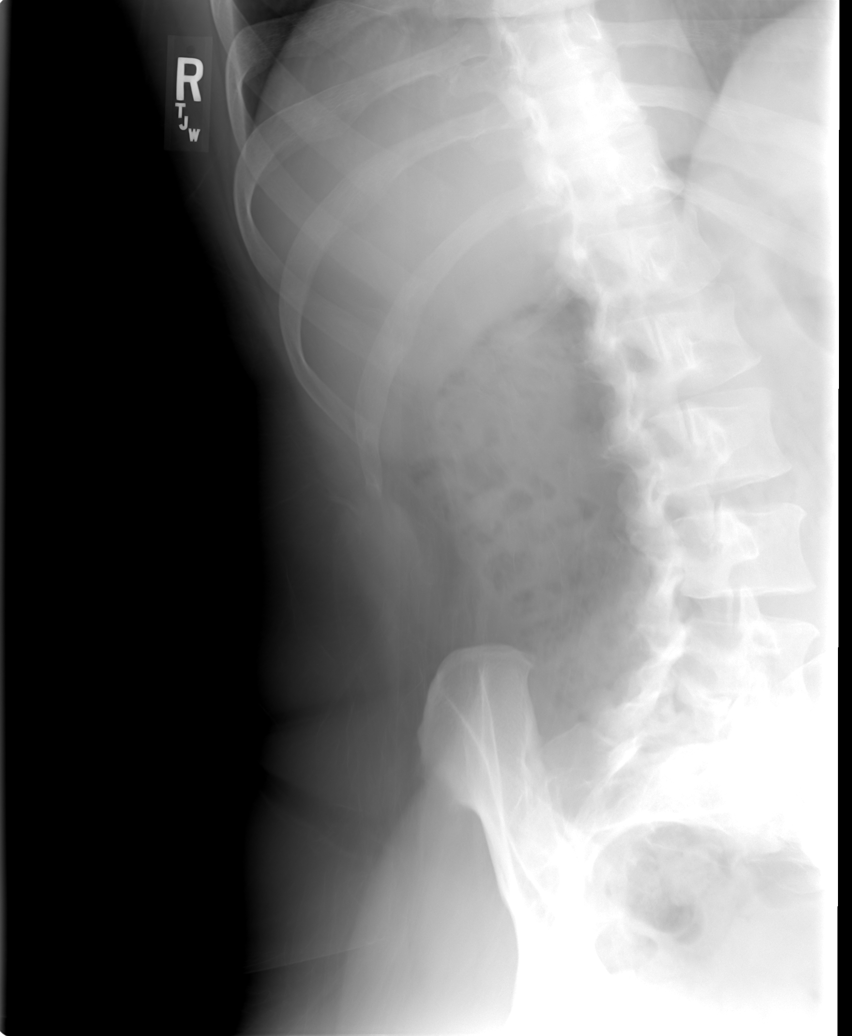

[view not recorded (3 of 5)]
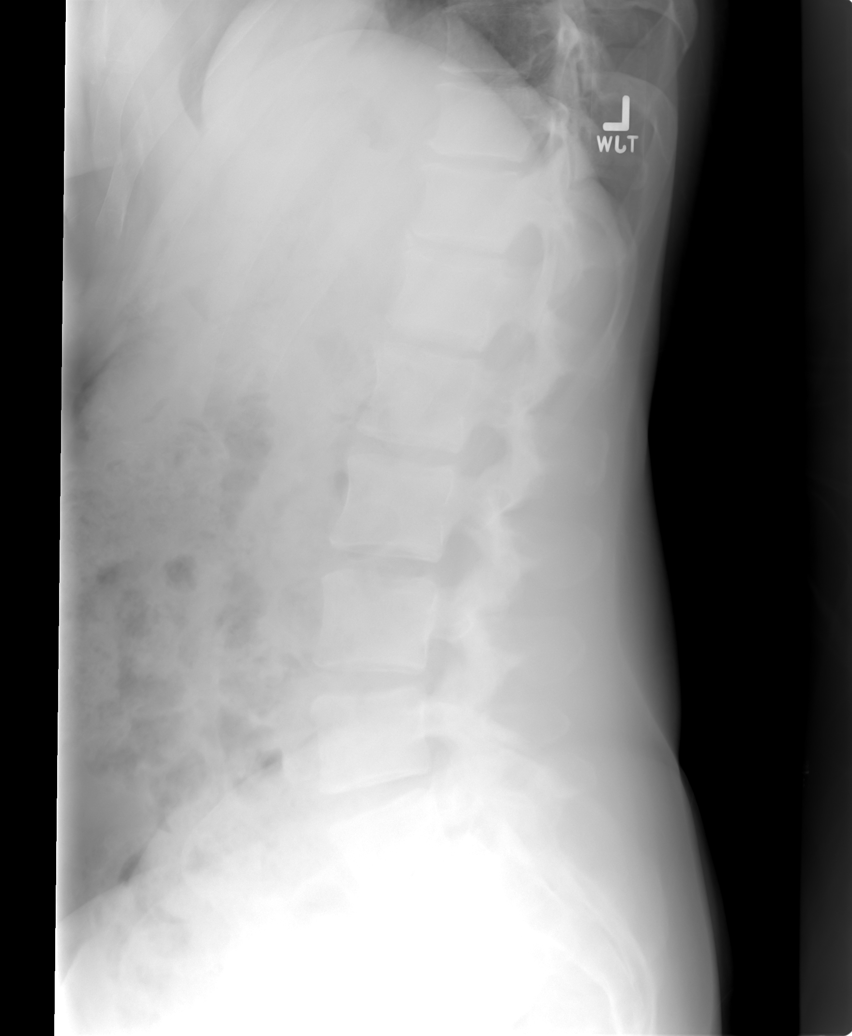

[view not recorded (4 of 5)]
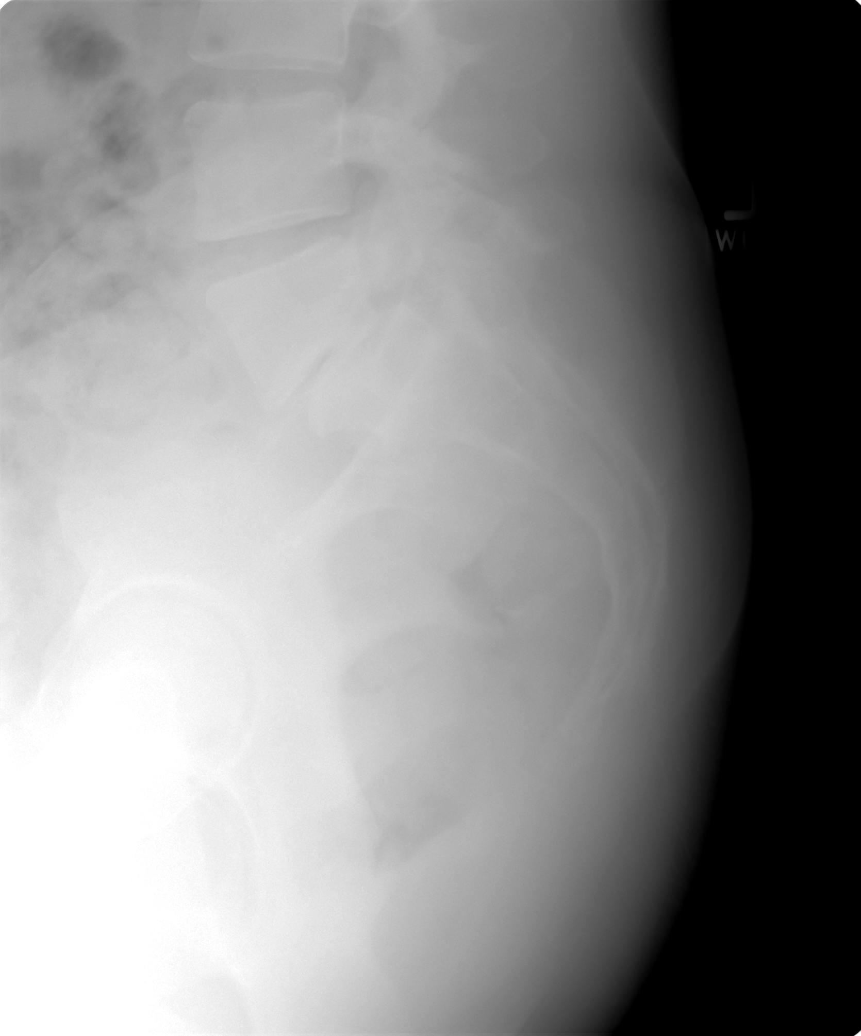

[view not recorded (5 of 5)]
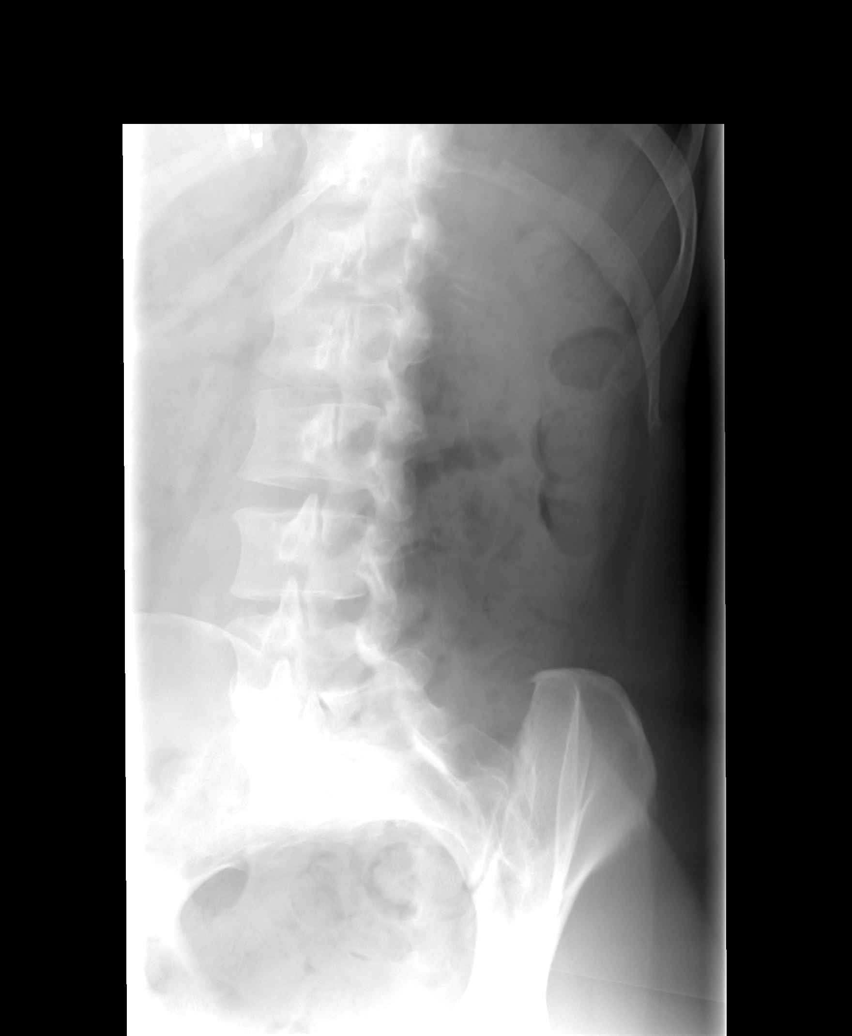

[5 of 5 positions shown; findings below may reference images not displayed]

FINDINGS: Frontal, lateral, spot lumbosacral lateral, and bilateral
oblique views were obtained.  There are five non-rib bearing lumbar
type vertebral bodies.  T12 ribs are hypoplastic.

There is no fracture.  There are pars defects at L5 and S1
bilaterally with 8 mm of anterolisthesis of L5 on S1.  There is 1
mm of retrolisthesis of L4 on L5.  No other spondylolisthesis.

There is marked disc space narrowing at L5-S1.  Other disc spaces
appear normal.  There is no appreciable facet arthropathy.
IMPRESSION: Spondylolisthesis at L5-S1 and to a much lesser degree at L4-5.
Pars defects are present at L5 and S1 bilaterally.  There is marked
disc space narrowing at L5-S1.  No fracture apparent.

## 2014-12-10 ENCOUNTER — Telehealth: Payer: Self-pay

## 2014-12-10 NOTE — Telephone Encounter (Signed)
PLEASE NOTE: All timestamps contained within this report are represented as [place] Standard Time. CONFIDENTIALTY NOTICE: This fax transmission is intended only for the addressee. It contains information that is legally privileged, confidential or otherwise protected from use or disclosure. If you are not the intended recipient, you are strictly prohibited from reviewing, disclosing, copying using or disseminating any of this information or taking any action in reliance on or regarding this information. If you have received this fax in error, please notify us immediately by telephone so that we can arrange for its return to us. Phone: [phone], Toll-Free: [phone], Fax: [phone] Page: 1 of 1 Call Id: [id number] [facility] Client TELEPHONE ADVICE RECORD [facility] Patient Name: Leslie GivenLKE Leatherbury Gender: Female DOB: 18-Oct-1961 Age: [age] Y 11 M 4 D Return Phone Number: [phone] (Primary) Address: [address] [place]State/Zip: [name] Client  [facility] Client Site  [facility] [place], [place]e [place] Contact Type Call Call Type Triage / Clinical Relationship To Patient Self Return Phone Number [phone] (Primary) Chief Complaint Chest Pain (non urgent symptoms) Initial Comment Caller states the patient has left chest pain Nurse Assessment Guidelines Guideline Title Affirmed Question Affirmed Notes Nurse Date/Time (Eastern Time) Disp. Time Lamount Cohen(Eastern Time) Disposition Final User 12/10/2014 4:02:28 PM Clinical Call Yes Eual FinesArmstrong, [place] After Care Instructions Shepard Call Event Type User Date / Time Description

## 2014-12-10 NOTE — Telephone Encounter (Signed)
I cannot tell how she was triaged from this note-please check in with her -thanks

## 2014-12-11 ENCOUNTER — Ambulatory Visit (INDEPENDENT_AMBULATORY_CARE_PROVIDER_SITE_OTHER): Payer: BLUE CROSS/BLUE SHIELD | Admitting: Family Medicine

## 2014-12-11 ENCOUNTER — Ambulatory Visit (INDEPENDENT_AMBULATORY_CARE_PROVIDER_SITE_OTHER)
Admission: RE | Admit: 2014-12-11 | Discharge: 2014-12-11 | Disposition: A | Discharge status: Home / Self Care | Payer: BLUE CROSS/BLUE SHIELD | Source: Ambulatory Visit | Attending: Family Medicine | Admitting: Family Medicine

## 2014-12-11 ENCOUNTER — Encounter: Payer: Self-pay | Admitting: Family Medicine

## 2014-12-11 VITALS — BP 122/76 | HR 74 | Temp 97.9°F | Ht 64.0 in | Wt 164.4 lb

## 2014-12-11 DIAGNOSIS — R0789 Other chest pain: Secondary | ICD-10-CM

## 2014-12-11 NOTE — Progress Notes (Signed)
Pre visit review using our clinic review tool, if applicable. No additional management support is needed unless otherwise documented below in the visit note. 

## 2014-12-11 NOTE — Telephone Encounter (Signed)
Pt called back this AM; 2 months ago pt started exercising; 5 weeks ago pt started with dull pain under lt breast in ribcage area on and off; occasionally pain radiates to lt axilla. Now pt has dull ache under lt breast;pain level 2. Pt said pain comes and goes and pt can be resting, sitting or moving around. Pt has no N&V, no SOB, no radiation of pain now and pt states is in no distress. Pt wanted to see Dr Milinda Antisower. Dr Reece AgarG said OK to schedule appt with Dr Milinda Antisower today at 2:15 but if pt condition changes or worsens prior to appt pt will go to ED for eval.Pt voiced understanding.

## 2014-12-11 NOTE — Progress Notes (Signed)
Subjective:    Patient ID: Leslie Shepard, female    DOB: 16-Jun-1961, 54 y.o.   MRN: [id number]  HPI Here for chest wall pain in L side  Thinks she strained her muscle   5-6 weeks ago began having pain in L side lateral to breast     (started working out 3 mo ago) -- was doing chest flies  Sore/ dull pain - esp to take a deep breath  Only on the left side  Tender to the touch on lateral breast   No exertional symptoms (can run 5 miles now- getting in good shape) Does get worse with certain position - and lifting L arm   She has had a baseline mammogram-unsure if she wants to continue them   Patient Active Problem List   Diagnosis Date Noted  . UTI (urinary tract infection) 06/12/2013  . Low back pain 06/08/2013  . Paresthesia of left leg 06/08/2013  . Heavy menses 04/18/2013  . Routine general medical examination at a health care facility 02/16/2013  . Encounter for routine gynecological examination 02/16/2013  . PREMENSTRUAL DYSPHORIC SYNDROME 12/31/2009  . HYPERLIPIDEMIA 11/13/2008  . THYROIDITIS, HX OF 11/13/2008   Past Medical History  Diagnosis Date  . Thyroiditis 2006    Endocrine-Dr. Talmage Nap (past)  . Hyperlipidemia   . PMDD (premenstrual dysphoric disorder)    No past surgical history on file. History  Substance Use Topics  . Smoking status: Never Smoker   . Smokeless tobacco: Not on file  . Alcohol Use: Yes     Comment: occasional   Family History  Problem Relation Age of Onset  . Hyperlipidemia Mother   . Seizures Father     epilepsy  . Breast cancer Maternal Aunt     M 1/2 Aunt ?  . Depression      grandparents  . Other Sister     carotid stenosis, smoker  . Hyperlipidemia Sister   . Bulemia Sister    Allergies  Allergen Reactions  . Statins     Malaise and muscle pain    No current outpatient prescriptions on file prior to visit.   No current facility-administered medications on file prior to visit.      Review of Systems Review of  Systems  Constitutional: Negative for fever, appetite change, fatigue and unexpected weight change.  Eyes: Negative for pain and visual disturbance.  Respiratory: Negative for cough and shortness of breath.  pos for pain on deep breath  Cardiovascular: Negative for cp or palpitations    Gastrointestinal: Negative for nausea, diarrhea and constipation.  Genitourinary: Negative for urgency and frequency.  Skin: Negative for pallor or rash   Neurological: Negative for weakness, light-headedness, numbness and headaches.  Hematological: Negative for adenopathy. Does not bruise/bleed easily.  Psychiatric/Behavioral: Negative for dysphoric mood. The patient is not nervous/anxious.         Objective:   Physical Exam  Constitutional: She appears well-nourished. No distress.  HENT:  Head: Normocephalic and atraumatic.  Mouth/Throat: Oropharynx is clear and moist.  Eyes: Conjunctivae and EOM are normal. Pupils are equal, round, and reactive to light. No scleral icterus.  Neck: Normal range of motion. Neck supple.  Cardiovascular: Normal rate, regular rhythm, normal heart sounds and intact distal pulses.   Pulmonary/Chest: Effort normal and breath sounds normal. No respiratory distress. She has no wheezes. She has no rales. She exhibits tenderness.  No crackles   Chest wall/rib tenderness - lower/lateral L ribs No skin change or crepitus  Abdominal: Soft. Bowel sounds are normal. She exhibits no distension. There is no tenderness. There is no rebound.  Genitourinary:  Breast exam: No mass, nodules, thickening, tenderness, bulging, retraction, inflamation, nipple discharge or skin changes noted.  No axillary or clavicular LA.      Lymphadenopathy:    She has no cervical adenopathy.  Neurological: She is alert.  Skin: Skin is warm and dry. No rash noted. No erythema. No pallor.  Psychiatric: She has a normal mood and affect.          Assessment & Plan:   Problem List Items  Addressed This Visit      Other   Left-sided chest wall pain - Primary    With tenderness over inferior/lateral L ribs -no crepitus  Suspect muscle strain from new exercise  Adv heat / stretching  cxr today  Pt is not interested in regular mammograms / rev last one/ no lumps on breast exam       Relevant Orders   DG Chest 2 View

## 2014-12-11 NOTE — Telephone Encounter (Signed)
I will see her then  

## 2014-12-11 NOTE — Patient Instructions (Signed)
Chest xray today  I think your pain is muscular  Avoid any particular exercise that hurts - no other restrictions Use heat if it bothers you  If symptoms worsen or do not start improve please let me know

## 2014-12-11 NOTE — Assessment & Plan Note (Signed)
With tenderness over inferior/lateral L ribs -no crepitus  Suspect muscle strain from new exercise  Adv heat / stretching  cxr today  Pt is not interested in regular mammograms / rev last one/ no lumps on breast exam

## 2015-04-28 ENCOUNTER — Encounter: Payer: Self-pay | Admitting: Family Medicine

## 2015-06-10 ENCOUNTER — Telehealth: Payer: Self-pay

## 2015-06-10 NOTE — Telephone Encounter (Signed)
Left a voicemail for patient in regards to scheduling a Mammogram.  

## 2015-06-23 ENCOUNTER — Other Ambulatory Visit: Payer: Self-pay

## 2015-06-30 ENCOUNTER — Ambulatory Visit (INDEPENDENT_AMBULATORY_CARE_PROVIDER_SITE_OTHER): Payer: BLUE CROSS/BLUE SHIELD | Admitting: Family Medicine

## 2015-06-30 ENCOUNTER — Encounter: Payer: Self-pay | Admitting: Family Medicine

## 2015-06-30 VITALS — BP 124/76 | HR 57 | Temp 98.0°F | Ht 64.75 in | Wt 158.0 lb

## 2015-06-30 DIAGNOSIS — Z0000 Encounter for general adult medical examination without abnormal findings: Secondary | ICD-10-CM

## 2015-06-30 NOTE — Patient Instructions (Signed)
I'm glad you are doing well  We will put off labs for a year at your request Keep eating healthy  Also continue the great exercise   Take care of yourself

## 2015-06-30 NOTE — Assessment & Plan Note (Signed)
Reviewed health habits including diet and exercise and skin cancer prevention Reviewed appropriate screening tests for age  Also reviewed health mt list, fam hx and immunization status , as well as social and family history   Pt is generally doing very well  Declines most health mt at this time - labs /colon cancer screening/mammogram  Is likely menopausal  Disc low sat fat diet for cholesterol -pt states she may want to check it at her next PE  Enc healthy habits

## 2015-06-30 NOTE — Progress Notes (Signed)
Pre visit review using our clinic review tool, if applicable. No additional management support is needed unless otherwise documented below in the visit note. 

## 2015-06-30 NOTE — Progress Notes (Signed)
Subjective:    Patient ID: Leslie Shepard, female    DOB: October 15, 1961, 54 y.o.   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  Hiking - did 28 miles in a day    Wt is down 6 lb with bmi of 26  Has some poison ivy on R arm- not bad   HepC/HIV-declines / has done screening before and not high risk    Mammogram -does not want to do one  Had a baseline  Does self exams  Colon cancer screen -declines this and IFOB  No stool changes that she has noticed   F/u shot - declines   Pap 4/14 -nl  Never had an abnormal pap  Only one small periods in 9 months  Now having some hot flashes again - manageable - feels pretty good   Declines blood work     Chemistry      Component Value Date/Time   NA 135 02/16/2013 1128   K 3.8 02/16/2013 1128   CL 102 02/16/2013 1128   CO2 27 02/16/2013 1128   BUN 11 02/16/2013 1128   CREATININE 0.8 02/16/2013 1128      Component Value Date/Time   CALCIUM 9.0 02/16/2013 1128   ALKPHOS 54 02/16/2013 1128   AST 20 02/16/2013 1128   ALT 18 02/16/2013 1128   BILITOT 0.5 02/16/2013 1128      Lab Results  Component Value Date   WBC 6.9 04/17/2013   HGB 9.2* 04/17/2013   HCT 27.0* 04/17/2013   MCV 90.1 04/17/2013   PLT 253.0 04/17/2013   feels better now without periods  Took nu iron and does not want to check this  Lab Results  Component Value Date   CHOL 255* 02/16/2013   HDL 60.20 02/16/2013   LDLCALC 120* 12/24/2009   LDLDIRECT 189.3 02/16/2013   TRIG 124.0 02/16/2013   CHOLHDL 4 02/16/2013   she watches what she eats - and is more careful  Not interested in checking for another year   Lab Results  Component Value Date   TSH 1.53 02/16/2013     Td 4/14- up to date   Patient Active Problem List   Diagnosis Date Noted  . Left-sided chest wall pain 12/11/2014  . UTI (urinary tract infection)  06/12/2013  . Low back pain 06/08/2013  . Paresthesia of left leg 06/08/2013  . Heavy menses 04/18/2013  . Routine general medical examination at a health care facility 02/16/2013  . Encounter for routine gynecological examination 02/16/2013  . PREMENSTRUAL DYSPHORIC SYNDROME 12/31/2009  . HYPERLIPIDEMIA 11/13/2008  . THYROIDITIS, HX OF 11/13/2008   Past Medical History  Diagnosis Date  . Thyroiditis 2006    Endocrine-Dr. Talmage Nap (past)  . Hyperlipidemia   . PMDD (premenstrual dysphoric disorder)    No past surgical history on file. Social History  Substance Use Topics  . Smoking status: Never Smoker   . Smokeless tobacco: None  . Alcohol Use: 0.0 oz/week    0 Standard drinks or equivalent per week     Comment: occasional   Family History  Problem Relation Age of Onset  . Hyperlipidemia Mother   . Seizures Father     epilepsy  . Breast cancer Maternal Aunt     M 1/2 Aunt ?  . Depression      grandparents  . Other Sister  carotid stenosis, smoker  . Hyperlipidemia Sister   . Bulemia Sister    Allergies  Allergen Reactions  . Statins     Malaise and muscle pain    No current outpatient prescriptions on file prior to visit.   No current facility-administered medications on file prior to visit.      Review of Systems    Review of Systems  Constitutional: Negative for fever, appetite change, fatigue and unexpected weight change.  Eyes: Negative for pain and visual disturbance.  Respiratory: Negative for cough and shortness of breath.   Cardiovascular: Negative for cp or palpitations    Gastrointestinal: Negative for nausea, diarrhea and constipation.  Genitourinary: Negative for urgency and frequency.  Skin: Negative for pallor and pos for poison ivy rash that it improving    Neurological: Negative for weakness, light-headedness, numbness and headaches.  Hematological: Negative for adenopathy. Does not bruise/bleed easily.  Psychiatric/Behavioral: Negative  for dysphoric mood. The patient is not nervous/anxious.      Objective:   Physical Exam  Constitutional: She appears well-developed and well-nourished. No distress.  Well appearing   HENT:  Head: Normocephalic and atraumatic.  Right Ear: External ear normal.  Left Ear: External ear normal.  Mouth/Throat: Oropharynx is clear and moist.  Eyes: Conjunctivae and EOM are normal. Pupils are equal, round, and reactive to light. No scleral icterus.  Neck: Normal range of motion. Neck supple. No JVD present. Carotid bruit is not present. No thyromegaly present.  Cardiovascular: Normal rate, regular rhythm, normal heart sounds and intact distal pulses.  Exam reveals no gallop.   Pulmonary/Chest: Effort normal and breath sounds normal. No respiratory distress. She has no wheezes. She exhibits no tenderness.  Abdominal: Soft. Bowel sounds are normal. She exhibits no distension, no abdominal bruit and no mass. There is no tenderness.  Genitourinary: No breast swelling, tenderness, discharge or bleeding.  Breast exam: No mass, nodules, thickening, tenderness, bulging, retraction, inflamation, nipple discharge or skin changes noted.  No axillary or clavicular LA.      Musculoskeletal: Normal range of motion. She exhibits no edema or tenderness.  Lymphadenopathy:    She has no cervical adenopathy.  Neurological: She is alert. She has normal reflexes. No cranial nerve deficit. She exhibits normal muscle tone. Coordination normal.  Skin: Skin is warm and dry. No rash noted. No erythema. No pallor.  Olive complexion Few lentigo  Stable birthmark on L lower abdomen-unchanged   Psychiatric: She has a normal mood and affect.          Assessment & Plan:   Problem List Items Addressed This Visit    Routine general medical examination at a health care facility - Primary    Reviewed health habits including diet and exercise and skin cancer prevention Reviewed appropriate screening tests for age  Also  reviewed health mt list, fam hx and immunization status , as well as social and family history   Pt is generally doing very well  Declines most health mt at this time - labs /colon cancer screening/mammogram  Is likely menopausal  Disc low sat fat diet for cholesterol -pt states she may want to check it at her next PE  Enc healthy habits

## 2016-07-30 ENCOUNTER — Ambulatory Visit (INDEPENDENT_AMBULATORY_CARE_PROVIDER_SITE_OTHER): Payer: BLUE CROSS/BLUE SHIELD | Admitting: Family Medicine

## 2016-07-30 ENCOUNTER — Other Ambulatory Visit (HOSPITAL_COMMUNITY)
Admission: RE | Admit: 2016-07-30 | Discharge: 2016-07-30 | Disposition: A | Discharge status: Home / Self Care | Payer: BLUE CROSS/BLUE SHIELD | Source: Ambulatory Visit | Attending: Family Medicine | Admitting: Family Medicine

## 2016-07-30 ENCOUNTER — Encounter: Payer: Self-pay | Admitting: Family Medicine

## 2016-07-30 VITALS — BP 116/74 | HR 56 | Temp 98.0°F | Ht 64.75 in | Wt 153.5 lb

## 2016-07-30 DIAGNOSIS — Z01419 Encounter for gynecological examination (general) (routine) without abnormal findings: Secondary | ICD-10-CM

## 2016-07-30 DIAGNOSIS — Z0000 Encounter for general adult medical examination without abnormal findings: Secondary | ICD-10-CM | POA: Diagnosis not present

## 2016-07-30 DIAGNOSIS — Z1151 Encounter for screening for human papillomavirus (HPV): Secondary | ICD-10-CM | POA: Insufficient documentation

## 2016-07-30 NOTE — Assessment & Plan Note (Signed)
Reviewed health habits including diet and exercise and skin cancer prevention Reviewed appropriate screening tests for age  Also reviewed health mt list, fam hx and immunization status , as well as social and family history   Pt declines most health mt - ie: breast cancer screen/colon cancer screen/flu imm  She has excellent health habits Declined labs today  Gyn exam done

## 2016-07-30 NOTE — Progress Notes (Signed)
Pre visit review using our clinic review tool, if applicable. No additional management support is needed unless otherwise documented below in the visit note. 

## 2016-07-30 NOTE — Patient Instructions (Addendum)
Keep taking care of yourself  Gyn exam done today  Stay fit!  Try to get 1200-1500 mg of calcium per day with at least 1000 iu of vitamin D - for bone health

## 2016-07-30 NOTE — Assessment & Plan Note (Signed)
Gyn exam with pap done today  No c/o No further menses She is tolerating vasomotor symptoms well

## 2016-07-30 NOTE — Progress Notes (Signed)
Subjective:    Patient ID: Leslie Shepard, female    DOB: 1961/10/19, 55 y.o.   MRN: [id number]  HPI Here for health maintenance exam and to review chronic medical problems    Doing well /feeling great  Had a good summer  Biked the whole [address]! -- in very good shape  Enjoys it   Takes good care of herself   Wt Readings from Last 3 Encounters:  07/30/16 153 lb 8 oz (69.6 kg)  06/30/15 158 lb (71.7 kg)  12/11/14 164 lb 6.4 oz (74.6 kg)  lost 10 lb in the past 3 mo after gaining  Some hot flashes- doing better overall with menopause    Pap 4/14- due for a pap  No more bleeding  No symptoms    Flu shot- declines   Mammogram 9/10- declines  No lumps on self exam   Colon cancer screening -declines  Tetanus shot 4/14  Hx of hyperlipidemia Intol of statin and declines other tx  Lab Results  Component Value Date   CHOL 255 (H) 02/16/2013   HDL 60.20 02/16/2013   LDLCALC 120 (H) 12/24/2009   LDLDIRECT 189.3 02/16/2013   TRIG 124.0 02/16/2013   CHOLHDL 4 02/16/2013  diet is excellent   Declines labs today   Patient Active Problem List   Diagnosis Date Noted  . Routine general medical examination at a health care facility 02/16/2013  . Encounter for routine gynecological examination 02/16/2013  . Hyperlipidemia 11/13/2008  . THYROIDITIS, HX OF 11/13/2008   Past Medical History:  Diagnosis Date  . Hyperlipidemia   . PMDD (premenstrual dysphoric disorder)   . Thyroiditis 2006   Endocrine-Dr. Talmage Nap (past)   No past surgical history on file. Social History  Substance Use Topics  . Smoking status: Never Smoker  . Smokeless tobacco: Never Used  . Alcohol use No   Family History  Problem Relation Age of Onset  . Hyperlipidemia Mother   . Seizures Father     epilepsy  . Breast cancer Maternal Aunt     M 1/2 Aunt ?  . Depression      grandparents  . Other Sister     carotid stenosis, smoker  . Hyperlipidemia Sister   . Bulemia Sister     Allergies  Allergen Reactions  . Statins     Malaise and muscle pain    No current outpatient prescriptions on file prior to visit.   No current facility-administered medications on file prior to visit.      Review of Systems    Review of Systems  Constitutional: Negative for fever, appetite change, fatigue and unexpected weight change.  Eyes: Negative for pain and visual disturbance.  Respiratory: Negative for cough and shortness of breath.   Cardiovascular: Negative for cp or palpitations    Gastrointestinal: Negative for nausea, diarrhea and constipation.  Genitourinary: Negative for urgency and frequency.  Skin: Negative for pallor or rash   Neurological: Negative for weakness, light-headedness, numbness and headaches.  Hematological: Negative for adenopathy. Does not bruise/bleed easily.  Psychiatric/Behavioral: Negative for dysphoric mood. The patient is not nervous/anxious.      Objective:   Physical Exam  Constitutional: She appears well-developed and well-nourished. No distress.  Well appearing   HENT:  Head: Normocephalic and atraumatic.  Right Ear: External ear normal.  Left Ear: External ear normal.  Mouth/Throat: Oropharynx is clear and moist.  Eyes: Conjunctivae and EOM are normal. Pupils are equal, round, and reactive to light. No  scleral icterus.  Neck: Normal range of motion. Neck supple. No JVD present. Carotid bruit is not present. No thyromegaly present.  Cardiovascular: Normal rate, regular rhythm, normal heart sounds and intact distal pulses.  Exam reveals no gallop.   Pulmonary/Chest: Effort normal and breath sounds normal. No respiratory distress. She has no wheezes. She exhibits no tenderness.  Abdominal: Soft. Bowel sounds are normal. She exhibits no distension, no abdominal bruit and no mass. There is no tenderness.  Genitourinary: No breast swelling, tenderness, discharge or bleeding.  Genitourinary Comments: Breast exam: No mass, nodules,  thickening, tenderness, bulging, retraction, inflamation, nipple discharge or skin changes noted.  No axillary or clavicular LA.             Anus appears normal w/o hemorrhoids or masses     External genitalia : nl appearance and hair distribution/no lesions     Urethral meatus : nl size, no lesions or prolapse     Urethra: no masses, tenderness or scarring    Bladder : no masses or tenderness     Vagina: nl general appearance, no discharge or  Lesions, no significant cystocele  or rectocele     Cervix: no lesions/ discharge or friability, posteriorly located    Uterus: nl size, contour, position, and mobility (not fixed) , non tender    Adnexa : no masses, tenderness, enlargement or nodularity        Musculoskeletal: Normal range of motion. She exhibits no edema or tenderness.  No kyphosis   Lymphadenopathy:    She has no cervical adenopathy.  Neurological: She is alert. She has normal reflexes. No cranial nerve deficit. She exhibits normal muscle tone. Coordination normal.  Skin: Skin is warm and dry. No rash noted. No erythema. No pallor.  Some lentigines   Psychiatric: She has a normal mood and affect.          Assessment & Plan:   Problem List Items Addressed This Visit      Other   Routine general medical examination at a health care facility - Primary    Reviewed health habits including diet and exercise and skin cancer prevention Reviewed appropriate screening tests for age  Also reviewed health mt list, fam hx and immunization status , as well as social and family history   Pt declines most health mt - ie: breast cancer screen/colon cancer screen/flu imm  She has excellent health habits Declined labs today  Gyn exam done       Encounter for routine gynecological examination    Gyn exam with pap done today  No c/o No further menses She is tolerating vasomotor symptoms well       Relevant Orders   Cytology - PAP    Other Visit  Diagnoses   None.

## 2016-08-03 LAB — CYTOLOGY - PAP

## 2017-09-19 ENCOUNTER — Encounter: Payer: Self-pay | Admitting: Family Medicine

## 2017-09-19 ENCOUNTER — Ambulatory Visit (INDEPENDENT_AMBULATORY_CARE_PROVIDER_SITE_OTHER): Payer: BLUE CROSS/BLUE SHIELD | Admitting: Family Medicine

## 2017-09-19 VITALS — BP 122/74 | HR 82 | Temp 98.3°F | Ht 64.75 in | Wt 159.5 lb

## 2017-09-19 DIAGNOSIS — N644 Mastodynia: Secondary | ICD-10-CM

## 2017-09-19 NOTE — Assessment & Plan Note (Signed)
Suspect from muscle origin- after overuse in several projects  Reassuring exam  Recommend mammogram for this and screening -pt declines (urged to re think this and let us know)  Recommend heat  Supportive bra while working  Update if not starting to improve in a week or if worsening

## 2017-09-19 NOTE — Patient Instructions (Signed)
Wear a supportive bra  Try warm compress  Hold back from pushing and pulling for a while  Get better pillows   If pain does not improve in 1-2 weeks let me know   I do recommend a mammogram either way- let me know if you change your mind about it

## 2017-09-19 NOTE — Progress Notes (Signed)
Subjective:    Patient ID: Leslie Shepard, female    DOB: 1960/12/25, 56 y.o.   MRN: [id number]  HPI Here for left breast pain   Pain in L breast  Radiates to L side and into the back At first she was alarmed but then realized she probably strained something   She slipped in her son's room  Also working on a rental house  Sleeping on "crummy" pillows  Thinks she has actually strained a muscle   Worse to lie down or change position  Raising arm at times makes it worse  No bruising or rash or skin change  No burning  No lumps -she kept checking on that   No menses in quite a while - but occ her breasts hurt  Hot flashes on and off   Wt Readings from Last 3 Encounters:  09/19/17 159 lb 8 oz (72.3 kg)  07/30/16 153 lb 8 oz (69.6 kg)  06/30/15 158 lb (71.7 kg)   Mammogram was 9/10-normal  As a rule she declines them -understands the risks   Patient Active Problem List   Diagnosis Date Noted  . Breast pain, left 09/19/2017  . Routine general medical examination at a health care facility 02/16/2013  . Encounter for routine gynecological examination 02/16/2013  . Hyperlipidemia 11/13/2008  . THYROIDITIS, HX OF 11/13/2008   Past Medical History:  Diagnosis Date  . Hyperlipidemia   . PMDD (premenstrual dysphoric disorder)   . Thyroiditis 2006   Endocrine-Dr. Talmage Nap (past)   History reviewed. No pertinent surgical history. Social History   Tobacco Use  . Smoking status: Never Smoker  . Smokeless tobacco: Never Used  Substance Use Topics  . Alcohol use: No    Alcohol/week: 0.0 oz  . Drug use: No   Family History  Problem Relation Age of Onset  . Hyperlipidemia Mother   . Seizures Father        epilepsy  . Breast cancer Maternal Aunt        M 1/2 Aunt ?  . Depression Unknown        grandparents  . Other Sister        carotid stenosis, smoker  . Hyperlipidemia Sister   . Bulemia Sister    Allergies  Allergen Reactions  . Statins     Malaise and muscle  pain    No current outpatient medications on file prior to visit.   No current facility-administered medications on file prior to visit.     Review of Systems  Constitutional: Negative for activity change, appetite change, fatigue, fever and unexpected weight change.  HENT: Negative for congestion, ear pain, rhinorrhea, sinus pressure and sore throat.   Eyes: Negative for pain, redness and visual disturbance.  Respiratory: Negative for cough, shortness of breath and wheezing.   Cardiovascular: Negative for chest pain and palpitations.  Gastrointestinal: Negative for abdominal pain, blood in stool, constipation and diarrhea.  Endocrine: Negative for polydipsia and polyuria.  Genitourinary: Negative for dysuria, frequency and urgency.       Pos for L breast and chest wall tenderness   Musculoskeletal: Positive for myalgias. Negative for arthralgias and back pain.  Skin: Negative for pallor and rash.  Allergic/Immunologic: Negative for environmental allergies.  Neurological: Negative for dizziness, syncope and headaches.  Hematological: Negative for adenopathy. Does not bruise/bleed easily.  Psychiatric/Behavioral: Negative for decreased concentration and dysphoric mood. The patient is not nervous/anxious.        Objective:   Physical Exam  Constitutional: She appears well-developed and well-nourished. No distress.  Well appearing   Eyes: Conjunctivae and EOM are normal. Pupils are equal, round, and reactive to light. Right eye exhibits no discharge. Left eye exhibits no discharge. No scleral icterus.  Neck: Normal range of motion. Neck supple. No JVD present. No thyromegaly present.  Cardiovascular: Normal rate, regular rhythm and normal heart sounds.  Pulmonary/Chest: Effort normal and breath sounds normal. No respiratory distress. She has no wheezes.  Genitourinary:  Genitourinary Comments: Breast exam: No mass, nodules, thickenin, bulging, retraction, inflamation, nipple discharge  or skin changes noted.  No axillary or clavicular LA.   Mild tenderness just under L breast  No rib tenderness or crepitus or skin change     Musculoskeletal: She exhibits no edema or tenderness.  No TS or CS tenderness  Lymphadenopathy:    She has no cervical adenopathy.  Neurological: She is alert. No cranial nerve deficit. She exhibits normal muscle tone. Coordination normal.  Skin: Skin is warm and dry. No rash noted. No erythema. No pallor.  Psychiatric: She has a normal mood and affect.          Assessment & Plan:   Problem List Items Addressed This Visit      Other   Breast pain, left    Suspect from muscle origin- after overuse in several projects  Reassuring exam  Recommend mammogram for this and screening -pt declines (urged to re think this and let us know)  Recommend heat  Supportive bra while working  Update if not starting to improve in a week or if worsening

## 2019-01-11 ENCOUNTER — Telehealth: Payer: Self-pay | Admitting: Family Medicine

## 2019-01-11 NOTE — Telephone Encounter (Signed)
Pt calling and stating she need a tetnus shot.

## 2019-01-11 NOTE — Telephone Encounter (Signed)
Left VM letting pt know she had a Tdap in 2014 so she would not be due until 2024 but if there was a reason she is requesting one early to call us back and let us know

## 2019-01-17 ENCOUNTER — Encounter: Payer: Self-pay | Admitting: Family Medicine

## 2019-01-17 ENCOUNTER — Other Ambulatory Visit (HOSPITAL_COMMUNITY)
Admission: RE | Admit: 2019-01-17 | Discharge: 2019-01-17 | Disposition: A | Discharge status: Home / Self Care | Payer: BLUE CROSS/BLUE SHIELD | Source: Ambulatory Visit | Attending: Family Medicine | Admitting: Family Medicine

## 2019-01-17 ENCOUNTER — Ambulatory Visit (INDEPENDENT_AMBULATORY_CARE_PROVIDER_SITE_OTHER): Payer: BLUE CROSS/BLUE SHIELD | Admitting: Family Medicine

## 2019-01-17 VITALS — BP 130/76 | HR 55 | Temp 97.4°F | Ht 64.5 in | Wt 150.6 lb

## 2019-01-17 DIAGNOSIS — Z0000 Encounter for general adult medical examination without abnormal findings: Secondary | ICD-10-CM

## 2019-01-17 DIAGNOSIS — Z01419 Encounter for gynecological examination (general) (routine) without abnormal findings: Secondary | ICD-10-CM | POA: Diagnosis not present

## 2019-01-17 DIAGNOSIS — E7800 Pure hypercholesterolemia, unspecified: Secondary | ICD-10-CM

## 2019-01-17 LAB — CBC WITH DIFFERENTIAL/PLATELET
BASOS ABS: 0.1 10*3/uL (ref 0.0–0.1)
BASOS PCT: 0.8 % (ref 0.0–3.0)
EOS ABS: 0.1 10*3/uL (ref 0.0–0.7)
Eosinophils Relative: 1.5 % (ref 0.0–5.0)
HEMATOCRIT: 41.1 % (ref 36.0–46.0)
HEMOGLOBIN: 13.8 g/dL (ref 12.0–15.0)
LYMPHS PCT: 22.4 % (ref 12.0–46.0)
Lymphs Abs: 1.5 10*3/uL (ref 0.7–4.0)
MCHC: 33.5 g/dL (ref 30.0–36.0)
MCV: 91.8 fl (ref 78.0–100.0)
MONOS PCT: 6.6 % (ref 3.0–12.0)
Monocytes Absolute: 0.5 10*3/uL (ref 0.1–1.0)
NEUTROS ABS: 4.7 10*3/uL (ref 1.4–7.7)
Neutrophils Relative %: 68.7 % (ref 43.0–77.0)
PLATELETS: 209 10*3/uL (ref 150.0–400.0)
RBC: 4.47 Mil/uL (ref 3.87–5.11)
RDW: 13.3 % (ref 11.5–15.5)
WBC: 6.9 10*3/uL (ref 4.0–10.5)

## 2019-01-17 LAB — COMPREHENSIVE METABOLIC PANEL
ALBUMIN: 4.5 g/dL (ref 3.5–5.2)
ALT: 228 U/L — ABNORMAL HIGH (ref 0–35)
AST: 51 U/L — ABNORMAL HIGH (ref 0–37)
Alkaline Phosphatase: 109 U/L (ref 39–117)
BUN: 16 mg/dL (ref 6–23)
CALCIUM: 10.1 mg/dL (ref 8.4–10.5)
CHLORIDE: 103 meq/L (ref 96–112)
CO2: 28 meq/L (ref 19–32)
Creatinine, Ser: 0.84 mg/dL (ref 0.40–1.20)
GFR: 69.88 mL/min (ref >60.00)
Glucose, Bld: 95 mg/dL (ref 70–99)
POTASSIUM: 4.1 meq/L (ref 3.5–5.1)
Sodium: 137 mEq/L (ref 135–145)
Total Bilirubin: 0.5 mg/dL (ref 0.2–1.2)
Total Protein: 7.5 g/dL (ref 6.0–8.3)

## 2019-01-17 LAB — LIPID PANEL
CHOL/HDL RATIO: 5
Cholesterol: 238 mg/dL — ABNORMAL HIGH (ref 0–200)
HDL: 47.5 mg/dL (ref >39.00)
LDL CALC: 164 mg/dL — AB (ref 0–99)
NONHDL: 190.75
TRIGLYCERIDES: 136 mg/dL (ref 0.0–149.0)
VLDL: 27.2 mg/dL (ref 0.0–40.0)

## 2019-01-17 LAB — TSH: TSH: 1.65 u[IU]/mL (ref 0.35–4.50)

## 2019-01-17 NOTE — Patient Instructions (Addendum)
The shingles vaccine is called Shingrix  Call your insurance -if affordable let us know -we can put you on a wait list here and at a pharmacy   If you get stomach symptoms again - tums is helpful  pepcid over the counter is helpful  If symptoms do not improve please come in   Keep taking good care of yourself   Labs today  Pap today

## 2019-01-17 NOTE — Assessment & Plan Note (Signed)
Rev last lipids Intol of statins Watching diet carefully  Lipid panel drawn today

## 2019-01-17 NOTE — Assessment & Plan Note (Signed)
Routine exam done  No c/o  Pap obtained

## 2019-01-17 NOTE — Assessment & Plan Note (Signed)
Reviewed health habits including diet and exercise and skin cancer prevention Reviewed appropriate screening tests for age  Also reviewed health mt list, fam hx and immunization status , as well as social and family history   Declines flu shot  Declines breast or colon cancer screening  Labs ordered today  Very good health habits  Gyn exam and pap done

## 2019-01-17 NOTE — Progress Notes (Signed)
Subjective:    Patient ID: Leslie Shepard, female    DOB: 05-Oct-1961, 58 y.o.   MRN: [id number]  HPI Here for health maintenance exam and to review chronic medical problems    Kids are both out of the house and in school/working  Less work this year  Husband is semi retired  Financial traderTraveling a lot   Feeling ok overall  Some issues with her stomach   ? Gastritis (upper abd pain and bloating -some back pain, belching)  Had an attack over the weekend  Is cutting out coffee and carbonated drinks  Now eating soup/broth/bland  Was nauseated/never vomited  Stopped nsaid and changed to tylenol  Improved since Monday    Wt Readings from Last 3 Encounters:  01/17/19 150 lb 9 oz (68.3 kg)  09/19/17 159 lb 8 oz (72.3 kg)  07/30/16 153 lb 8 oz (69.6 kg)  lost 11 lb since January Doing well - going to spin classes in [place]  25.44 kg/m   Zoster status -interested in shingrix   Flu vaccine - declines   Mammogram -declines  Self breast exam -no lumps   Colon cancer screening - declines   Pap 9/17 neg with neg HPV She wants to get it today No gyn symptoms   Tetanus shot 4/14  Hyperlipidemia Eating a little better -so hopes it will look good  Intol to statins  Wants to check today  Lab Results  Component Value Date   CHOL 255 (H) 02/16/2013   CHOL 191 12/24/2009   CHOL 212 (HH) 11/26/2008   Lab Results  Component Value Date   HDL 60.20 02/16/2013   HDL 55.50 12/24/2009   HDL 48.4 11/26/2008   Lab Results  Component Value Date   LDLCALC 120 (H) 12/24/2009   Lab Results  Component Value Date   TRIG 124.0 02/16/2013   TRIG 80.0 12/24/2009   TRIG 64 11/26/2008   Lab Results  Component Value Date   CHOLHDL 4 02/16/2013   CHOLHDL 3 12/24/2009   CHOLHDL 4.4 CALC 11/26/2008   Lab Results  Component Value Date   LDLDIRECT 189.3 02/16/2013   LDLDIRECT 144.7 11/26/2008   Intolerant of statins  Watches diet carefully   Patient Active Problem List   Diagnosis  Date Noted  . Breast pain, left 09/19/2017  . Routine general medical examination at a health care facility 02/16/2013  . Encounter for routine gynecological examination 02/16/2013  . Hyperlipidemia 11/13/2008  . THYROIDITIS, HX OF 11/13/2008   Past Medical History:  Diagnosis Date  . Hyperlipidemia   . PMDD (premenstrual dysphoric disorder)   . Thyroiditis 2006   Endocrine-Dr. Talmage NapBalan (past)   History reviewed. No pertinent surgical history. Social History   Tobacco Use  . Smoking status: Never Smoker  . Smokeless tobacco: Never Used  Substance Use Topics  . Alcohol use: No    Alcohol/week: 0.0 standard drinks  . Drug use: No   Family History  Problem Relation Age of Onset  . Hyperlipidemia Mother   . Seizures Father        epilepsy  . Breast cancer Maternal Aunt        M 1/2 Aunt ?  . Depression Unknown        grandparents  . Other Sister        carotid stenosis, smoker  . Hyperlipidemia Sister   . Bulemia Sister    Allergies  Allergen Reactions  . Statins     Malaise and muscle pain  No current outpatient medications on file prior to visit.   No current facility-administered medications on file prior to visit.      Review of Systems  Constitutional: Negative for activity change, appetite change, fatigue, fever and unexpected weight change.  HENT: Negative for congestion, ear pain, rhinorrhea, sinus pressure and sore throat.        Had a ST- better now   Eyes: Negative for pain, redness and visual disturbance.  Respiratory: Negative for cough, shortness of breath and wheezing.   Cardiovascular: Negative for chest pain and palpitations.  Gastrointestinal: Negative for abdominal pain, blood in stool, constipation and diarrhea.       Had gastritis ,better now   Endocrine: Negative for polydipsia and polyuria.  Genitourinary: Negative for dysuria, frequency and urgency.  Musculoskeletal: Negative for arthralgias, back pain and myalgias.  Skin: Negative for  pallor and rash.  Allergic/Immunologic: Negative for environmental allergies.  Neurological: Negative for dizziness, syncope and headaches.  Hematological: Negative for adenopathy. Does not bruise/bleed easily.  Psychiatric/Behavioral: Negative for decreased concentration and dysphoric mood. The patient is not nervous/anxious.        Objective:   Physical Exam Constitutional:      General: She is not in acute distress.    Appearance: Normal appearance. She is well-developed and normal weight. She is not ill-appearing.  HENT:     Head: Normocephalic and atraumatic.     Right Ear: Tympanic membrane, ear canal and external ear normal.     Left Ear: Tympanic membrane, ear canal and external ear normal.     Nose: Nose normal.     Mouth/Throat:     Mouth: Mucous membranes are moist.     Pharynx: Oropharynx is clear.  Eyes:     General: No scleral icterus.    Conjunctiva/sclera: Conjunctivae normal.     Pupils: Pupils are equal, round, and reactive to light.  Neck:     Musculoskeletal: Normal range of motion and neck supple.     Thyroid: No thyromegaly.     Vascular: No carotid bruit or JVD.  Cardiovascular:     Rate and Rhythm: Normal rate and regular rhythm.     Pulses: Normal pulses.     Heart sounds: Normal heart sounds. No gallop.   Pulmonary:     Effort: Pulmonary effort is normal. No respiratory distress.     Breath sounds: Normal breath sounds. No wheezing.  Chest:     Chest wall: No tenderness.  Abdominal:     General: Bowel sounds are normal. There is no distension or abdominal bruit.     Palpations: Abdomen is soft. There is no mass.     Tenderness: There is no abdominal tenderness.  Genitourinary:    Comments: Breast exam: No mass, nodules, thickening, tenderness, bulging, retraction, inflamation, nipple discharge or skin changes noted.  No axillary or clavicular LA.              Anus appears normal w/o hemorrhoids or masses     External genitalia : nl  appearance and hair distribution/no lesions     Urethral meatus : nl size, no lesions or prolapse     Urethra: no masses, tenderness or scarring    Bladder : no masses or tenderness     Vagina: nl general appearance, no discharge or  Lesions, no significant cystocele  or rectocele     Cervix: no lesions/ discharge or friability    Uterus: nl size, contour, position, and mobility (not fixed) ,  non tender    Adnexa : no masses, tenderness, enlargement or nodularity       Musculoskeletal: Normal range of motion.        General: No tenderness.     Right lower leg: No edema.     Left lower leg: No edema.     Comments: No kyphosis   Lymphadenopathy:     Cervical: No cervical adenopathy.  Skin:    General: Skin is warm and dry.     Capillary Refill: Capillary refill takes less than 2 seconds.     Coloration: Skin is not pale.     Findings: No erythema, lesion or rash.     Comments: Some lengitines   Neurological:     General: No focal deficit present.     Mental Status: She is alert.     Cranial Nerves: No cranial nerve deficit.     Motor: No abnormal muscle tone.     Coordination: Coordination normal.     Deep Tendon Reflexes: Reflexes are normal and symmetric.  Psychiatric:        Mood and Affect: Mood normal.           Assessment & Plan:   Problem List Items Addressed This Visit      Other   Hyperlipidemia    Rev last lipids Intol of statins Watching diet carefully  Lipid panel drawn today      Relevant Orders   Lipid panel (Completed)   Routine general medical examination at a health care facility - Primary    Reviewed health habits including diet and exercise and skin cancer prevention Reviewed appropriate screening tests for age  Also reviewed health mt list, fam hx and immunization status , as well as social and family history   Declines flu shot  Declines breast or colon cancer screening  Labs ordered today  Very good health habits  Gyn  exam and pap done      Relevant Orders   CBC with Differential/Platelet (Completed)   Lipid panel (Completed)   TSH (Completed)   Comprehensive metabolic panel (Completed)   Encounter for routine gynecological examination    Routine exam done  No c/o  Pap obtained       Relevant Orders   Cytology - PAP([place])

## 2019-01-18 ENCOUNTER — Telehealth: Payer: Self-pay | Admitting: Family Medicine

## 2019-01-18 NOTE — Telephone Encounter (Signed)
Patient returned call about her lab results.  Please call patient back.

## 2019-01-18 NOTE — Telephone Encounter (Signed)
Pt calling again about her lab results. Please call pt

## 2019-01-19 LAB — CYTOLOGY - PAP
DIAGNOSIS: NEGATIVE
HPV (WINDOPATH): NOT DETECTED

## 2019-01-19 NOTE — Telephone Encounter (Signed)
Pt called back and discussed with pt directly

## 2019-01-19 NOTE — Telephone Encounter (Signed)
Addressed yesterday and Dr. Milinda Antis responded, called pt today to give Dr. Royden Purl response and no answer so left VM requesting pt to call the office back

## 2019-01-22 ENCOUNTER — Encounter: Payer: BLUE CROSS/BLUE SHIELD | Admitting: Family Medicine

## 2019-01-25 ENCOUNTER — Telehealth: Payer: Self-pay | Admitting: Family Medicine

## 2019-01-25 ENCOUNTER — Other Ambulatory Visit: Payer: BLUE CROSS/BLUE SHIELD

## 2019-01-25 ENCOUNTER — Other Ambulatory Visit: Payer: Self-pay

## 2019-01-25 NOTE — Telephone Encounter (Signed)
I have worked with this patient to get pricing for her labs. The acute hepatitis panel with [facility] is $501.68 so she would like to get a paper script for labs requested so she can take it to a draw station within [facility]. She said she would talk with [employer] this afternoon to get a location and she would like to pick up the script tomorrow. I told her I would call her when script is ready.

## 2019-01-26 ENCOUNTER — Other Ambulatory Visit: Payer: Self-pay | Admitting: Family Medicine

## 2019-01-26 NOTE — Telephone Encounter (Signed)
I spoke with pt and she has picked up paper script.

## 2019-01-26 NOTE — Telephone Encounter (Signed)
I put order in IN box  

## 2019-01-26 NOTE — Telephone Encounter (Signed)
Orders given to [employer]

## 2019-01-27 LAB — HEPATIC FUNCTION PANEL
ALBUMIN: 4.9 g/dL (ref 3.8–4.9)
ALK PHOS: 100 IU/L (ref 39–117)
ALT: 72 IU/L — ABNORMAL HIGH (ref 0–32)
AST: 30 IU/L (ref 0–40)
Bilirubin Total: 0.3 mg/dL (ref 0.0–1.2)
Bilirubin, Direct: 0.11 mg/dL (ref 0.00–0.40)
TOTAL PROTEIN: 7.2 g/dL (ref 6.0–8.5)

## 2019-01-27 LAB — HEPATITIS PANEL, ACUTE
HEP B C IGM: NEGATIVE
HEP B S AG: NEGATIVE
Hep A IgM: NEGATIVE
Hep C Virus Ab: <0.1 s/co ratio (ref 0.0–0.9)

## 2019-01-29 ENCOUNTER — Telehealth: Payer: Self-pay | Admitting: Family Medicine

## 2019-01-29 NOTE — Telephone Encounter (Signed)
That sounds good! Liver tests should probably be repeated in 3 mo or so just to track and see if it it all comes back to normal That can be decided then based on insurance issues (she can let us know)

## 2019-01-29 NOTE — Telephone Encounter (Signed)
Pt said overall she is feeling a lot better, she only has a scratchy throat but she's pretty sure it's from her allergies since she never got sick at all pt said she also did listen to Dr. Royden Purl instructions regarding restrictions. Pt said she hasn't had any alcohol, no tylenol, no coffee, no ibuprofen, no carbonated drinks, and she has increased her water intake pt said she is doing all of that and she can tell a difference in the way she is feeling because she feels a lot better

## 2019-01-29 NOTE — Telephone Encounter (Signed)
Left VM requesting pt to call the office back 

## 2019-01-29 NOTE — Telephone Encounter (Signed)
Please let pt know that labs are improved AST is down from 51 to 30  ALT down from 228 to 72  Negative viral hepatitis screen   How is she feeling?  Has she changed anything intake wise? Thanks

## 2019-01-29 NOTE — Telephone Encounter (Signed)
Pt said she will get order on Rx pad again and take it to her lab in 3 months

## 2019-02-01 ENCOUNTER — Encounter: Payer: Self-pay | Admitting: Family Medicine

## 2019-02-01 DIAGNOSIS — R748 Abnormal levels of other serum enzymes: Secondary | ICD-10-CM | POA: Insufficient documentation

## 2019-02-08 ENCOUNTER — Encounter: Payer: BLUE CROSS/BLUE SHIELD | Admitting: Family Medicine

## 2019-06-13 ENCOUNTER — Other Ambulatory Visit: Payer: Self-pay | Admitting: Family Medicine

## 2019-06-14 LAB — HEPATIC FUNCTION PANEL
ALT: 20 IU/L (ref 0–32)
AST: 19 IU/L (ref 0–40)
Albumin: 4.7 g/dL (ref 3.8–4.9)
Alkaline Phosphatase: 73 IU/L (ref 39–117)
Bilirubin Total: 0.4 mg/dL (ref 0.0–1.2)
Bilirubin, Direct: 0.1 mg/dL (ref 0.00–0.40)
Total Protein: 7.4 g/dL (ref 6.0–8.5)

## 2021-11-03 ENCOUNTER — Other Ambulatory Visit: Payer: Self-pay

## 2021-11-03 ENCOUNTER — Encounter: Payer: Self-pay | Admitting: Family Medicine

## 2021-11-03 ENCOUNTER — Ambulatory Visit (INDEPENDENT_AMBULATORY_CARE_PROVIDER_SITE_OTHER): Payer: 59 | Admitting: Family Medicine

## 2021-11-03 DIAGNOSIS — R112 Nausea with vomiting, unspecified: Secondary | ICD-10-CM | POA: Insufficient documentation

## 2021-11-03 DIAGNOSIS — R197 Diarrhea, unspecified: Secondary | ICD-10-CM | POA: Diagnosis not present

## 2021-11-03 LAB — CBC WITH DIFFERENTIAL/PLATELET
Basophils Absolute: 0.1 10*3/uL (ref 0.0–0.1)
Basophils Relative: 1.5 % (ref 0.0–3.0)
Eosinophils Absolute: 0.1 10*3/uL (ref 0.0–0.7)
Eosinophils Relative: 2.4 % (ref 0.0–5.0)
HCT: 39.7 % (ref 36.0–46.0)
Hemoglobin: 13.1 g/dL (ref 12.0–15.0)
Lymphocytes Relative: 38 % (ref 12.0–46.0)
Lymphs Abs: 1.8 10*3/uL (ref 0.7–4.0)
MCHC: 33 g/dL (ref 30.0–36.0)
MCV: 91.4 fl (ref 78.0–100.0)
Monocytes Absolute: 0.3 10*3/uL (ref 0.1–1.0)
Monocytes Relative: 7 % (ref 3.0–12.0)
Neutro Abs: 2.4 10*3/uL (ref 1.4–7.7)
Neutrophils Relative %: 51.1 % (ref 43.0–77.0)
Platelets: 328 10*3/uL (ref 150.0–400.0)
RBC: 4.34 Mil/uL (ref 3.87–5.11)
RDW: 14.1 % (ref 11.5–15.5)
WBC: 4.7 10*3/uL (ref 4.0–10.5)

## 2021-11-03 LAB — BASIC METABOLIC PANEL
BUN: 14 mg/dL (ref 6–23)
CO2: 28 mEq/L (ref 19–32)
Calcium: 9.9 mg/dL (ref 8.4–10.5)
Chloride: 104 mEq/L (ref 96–112)
Creatinine, Ser: 0.86 mg/dL (ref 0.40–1.20)
GFR: 73.74 mL/min (ref >60.00)
Glucose, Bld: 93 mg/dL (ref 70–99)
Potassium: 4.7 mEq/L (ref 3.5–5.1)
Sodium: 137 mEq/L (ref 135–145)

## 2021-11-03 LAB — HEPATIC FUNCTION PANEL
ALT: 19 U/L (ref 0–35)
AST: 17 U/L (ref 0–37)
Albumin: 4.4 g/dL (ref 3.5–5.2)
Alkaline Phosphatase: 53 U/L (ref 39–117)
Bilirubin, Direct: 0.1 mg/dL (ref 0.0–0.3)
Total Bilirubin: 0.5 mg/dL (ref 0.2–1.2)
Total Protein: 7.5 g/dL (ref 6.0–8.3)

## 2021-11-03 LAB — TSH: TSH: 1.99 u[IU]/mL (ref 0.35–5.50)

## 2021-11-03 NOTE — Patient Instructions (Signed)
Stay very well hydrated   If you have another episode please note what you are before and keep track of that   Labs today   Consider a GI specialist visit if this happens again   It is still possible that you had viruses as well

## 2021-11-03 NOTE — Progress Notes (Signed)
Subjective:    Patient ID: Leslie Shepard, female    DOB: August 10, 1961, 60 y.o.   MRN: [id number]  This visit occurred during the SARS-CoV-2 public health emergency.  Safety protocols were in place, including screening questions prior to the visit, additional usage of staff PPE, and extensive cleaning of exam room while observing appropriate contact time as indicated for disinfecting solutions.   HPI Pt presents for f/u of n/v/d   Wt Readings from Last 3 Encounters:  11/03/21 156 lb 8 oz (71 kg)  01/17/19 150 lb 9 oz (68.3 kg)  09/19/17 159 lb 8 oz (72.3 kg)   26.45 kg/m  She has had 3 episodes of n/v/d  First time about a year ago after a wedding  2nd one was coming back from [place] (covid neg) -husb sick also but not GI  3rd episode was several weeks ago   Episode:  Starts out of the blue - with some abdominal funny feeling /feels poorly  No fever but feels chills  Then nausea - and then vomiting  Then uncomfortable (cannot get comfortable) -about 3-5 hours  Stool gets very loose and more volume (no blood and not black) Then dizzy  Urine gets darker until she can drink more fluids  Then sleep  Has to eat bland /pretzels  Some juices   Occ takes nsaid for sore muscles -tries to minimize that   No GI meds over the counter  Does not tend to have heartburn very often/is rare   Occ constipation- mild and occasional /that is her normal     No one else has become sick No triggers at all  No dietary link   She goes to the gym regularly- spin biking and enjoys it  Doing well overall   BP Readings from Last 3 Encounters:  11/03/21 118/76  01/17/19 130/76  09/19/17 122/74   Pulse Readings from Last 3 Encounters:  11/03/21 70  01/17/19 (!) 55  09/19/17 82     Lab Results  Component Value Date   CREATININE 0.84 01/17/2019   BUN 16 01/17/2019   NA 137 01/17/2019   K 4.1 01/17/2019   CL 103 01/17/2019   CO2 28 01/17/2019   Lab Results  Component Value  Date   ALT 20 06/13/2019   AST 19 06/13/2019   ALKPHOS 73 06/13/2019   BILITOT 0.4 06/13/2019    Patient Active Problem List   Diagnosis Date Noted   Nausea vomiting and diarrhea 11/03/2021   Elevated liver enzymes 02/01/2019   Routine general medical examination at a health care facility 02/16/2013   Encounter for routine gynecological examination 02/16/2013   Hyperlipidemia 11/13/2008   THYROIDITIS, HX OF 11/13/2008   Past Medical History:  Diagnosis Date   Hyperlipidemia    PMDD (premenstrual dysphoric disorder)    Thyroiditis 2006   Endocrine-Dr. Talmage Nap (past)   History reviewed. No pertinent surgical history. Social History   Tobacco Use   Smoking status: Never   Smokeless tobacco: Never  Substance Use Topics   Alcohol use: No    Alcohol/week: 0.0 standard drinks   Drug use: No   Family History  Problem Relation Age of Onset   Hyperlipidemia Mother    Seizures Father        epilepsy   Breast cancer Maternal Aunt        M 1/2 Aunt ?   Depression Other        grandparents   Other Sister  carotid stenosis, smoker   Hyperlipidemia Sister    Bulemia Sister    Allergies  Allergen Reactions   Statins     Malaise and muscle pain    No current outpatient medications on file prior to visit.   No current facility-administered medications on file prior to visit.     Review of Systems  Constitutional:  Negative for activity change, appetite change, fatigue, fever and unexpected weight change.  HENT:  Negative for congestion, ear pain, rhinorrhea, sinus pressure and sore throat.   Eyes:  Negative for pain, redness and visual disturbance.  Respiratory:  Negative for cough, shortness of breath and wheezing.   Cardiovascular:  Negative for chest pain and palpitations.  Gastrointestinal:  Negative for abdominal distention, abdominal pain, anal bleeding, blood in stool, constipation, diarrhea, nausea, rectal pain and vomiting.       GI symptoms are now resolved     Endocrine: Negative for polydipsia and polyuria.  Genitourinary:  Negative for dysuria, frequency and urgency.  Musculoskeletal:  Negative for arthralgias, back pain and myalgias.  Skin:  Negative for pallor and rash.  Allergic/Immunologic: Negative for environmental allergies.  Neurological:  Negative for dizziness, syncope and headaches.  Hematological:  Negative for adenopathy. Does not bruise/bleed easily.  Psychiatric/Behavioral:  Negative for decreased concentration and dysphoric mood. The patient is not nervous/anxious.       Objective:   Physical Exam Constitutional:      General: She is not in acute distress.    Appearance: Normal appearance. She is well-developed and normal weight. She is not ill-appearing or diaphoretic.  HENT:     Head: Normocephalic and atraumatic.     Mouth/Throat:     Mouth: Mucous membranes are moist.  Eyes:     General: No scleral icterus.    Conjunctiva/sclera: Conjunctivae normal.     Pupils: Pupils are equal, round, and reactive to light.  Cardiovascular:     Rate and Rhythm: Normal rate and regular rhythm.     Heart sounds: Normal heart sounds.  Pulmonary:     Effort: Pulmonary effort is normal. No respiratory distress.     Breath sounds: Normal breath sounds. No wheezing or rales.  Abdominal:     General: Bowel sounds are normal. There is no distension.     Palpations: Abdomen is soft. There is no hepatomegaly, splenomegaly or mass.     Tenderness: There is no abdominal tenderness. There is no right CVA tenderness, left CVA tenderness, guarding or rebound. Negative signs include Murphy's sign and McBurney's sign.  Musculoskeletal:     Cervical back: Normal range of motion and neck supple.     Right lower leg: No edema.     Left lower leg: No edema.  Lymphadenopathy:     Cervical: No cervical adenopathy.  Skin:    General: Skin is warm and dry.     Coloration: Skin is not pale.     Findings: No erythema.  Neurological:     Mental  Status: She is alert.     Cranial Nerves: No cranial nerve deficit.  Psychiatric:        Mood and Affect: Mood normal.          Assessment & Plan:   Problem List Items Addressed This Visit       Digestive   Nausea vomiting and diarrhea    Pt has had 3 episodes (one after a wedding and one after traveling)  Disc possibility of viral illness Also food intolerance  Doubt IBS would cause vomiting  She is hydrating/ eating bland for now and no residual symptoms  Will check labs  Follow closely  Consider GI referral if symptoms return      Relevant Orders   CBC with Differential/Platelet (Completed)   Basic metabolic panel (Completed)   Hepatic function panel (Completed)   TSH (Completed)   Acute Hep Panel & Hep B Surface Ab

## 2021-11-03 NOTE — Assessment & Plan Note (Signed)
Pt has had 3 episodes (one after a wedding and one after traveling)  Disc possibility of viral illness Also food intolerance  Doubt IBS would cause vomiting  She is hydrating/ eating bland for now and no residual symptoms  Will check labs  Follow closely  Consider GI referral if symptoms return

## 2021-11-04 LAB — ACUTE HEP PANEL AND HEP B SURFACE AB
HEPATITIS C ANTIBODY REFILL$(REFL): NONREACTIVE
Hep A IgM: NONREACTIVE
Hep B C IgM: NONREACTIVE
Hepatitis B Surface Ag: NONREACTIVE
SIGNAL TO CUT-OFF: 0.05 (ref <1.00)

## 2021-11-04 LAB — REFLEX TIQ

## 2023-03-22 ENCOUNTER — Ambulatory Visit (INDEPENDENT_AMBULATORY_CARE_PROVIDER_SITE_OTHER): Payer: 59 | Admitting: Family Medicine

## 2023-03-22 ENCOUNTER — Encounter: Payer: Self-pay | Admitting: Family Medicine

## 2023-03-22 VITALS — BP 130/70 | HR 69 | Temp 98.0°F | Ht 64.0 in | Wt 149.0 lb

## 2023-03-22 DIAGNOSIS — G5711 Meralgia paresthetica, right lower limb: Secondary | ICD-10-CM | POA: Insufficient documentation

## 2023-03-22 NOTE — Progress Notes (Signed)
Subjective:    Patient ID: Leslie Shepard, female    DOB: [birth year], 61 y.o.   MRN: [id number]  HPI Pt presents with c/o right leg pain   Wt Readings from Last 3 Encounters:  03/22/23 149 lb (67.6 kg)  11/03/21 156 lb 8 oz (71 kg)  01/17/19 150 lb 9 oz (68.3 kg)   25.58 kg/m  Lost some weight with intermittent fasting and exercise -is pleased   Vitals:   03/22/23 1159  BP: 130/70  Pulse: 69  Temp: 98 F (36.7 C)  SpO2: 99%   Started 4-6 weeks ago  Working out a lot more lately  Shooting pain / like a zap (fleeting) or electric current  Brief but intense  Hurts at times to put wt on leg    (she hikes and bikes and spins and yoga)  No swelling or redness or warmth   No particular trigger  Happens for no reason   Back is ok /no pain  No pain in the groin  No hip pain   Feet are not cold or hot  No color change   Patient Active Problem List   Diagnosis Date Noted   Lateral femoral cutaneous entrapment syndrome, 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 Talmage Nap (past)   History reviewed. No pertinent surgical history. Social History   Tobacco Use   Smoking status: Never   Smokeless tobacco: Never  Substance Use Topics   Alcohol use: No    Alcohol/week: 0.0 standard drinks of alcohol   Drug use: No   Family History  Problem Relation Age of Onset   Hyperlipidemia Mother    Seizures Father        epilepsy   Breast cancer Maternal Aunt        M 1/2 Aunt ?   Depression Other        grandparents   Other Sister        carotid stenosis, smoker   Hyperlipidemia Sister    Bulemia Sister    Allergies  Allergen  Reactions   Statins     Malaise and muscle pain    No current outpatient medications on file prior to visit.   No current facility-administered medications on file prior to visit.     Review of Systems  Constitutional:  Negative for activity change, appetite change, fatigue, fever and unexpected weight change.  HENT:  Negative for congestion, ear pain, rhinorrhea, sinus pressure and sore throat.   Eyes:  Negative for pain, redness and visual disturbance.  Respiratory:  Negative for cough, shortness of breath and wheezing.   Cardiovascular:  Negative for chest pain and palpitations.  Gastrointestinal:  Negative for abdominal pain, blood in stool, constipation and diarrhea.  Endocrine: Negative for polydipsia and polyuria.  Genitourinary:  Negative for dysuria, frequency and urgency.  Musculoskeletal:  Negative for arthralgias, back pain and myalgias.       Pain in lateral R calf  No recent back pain but is prone to it  Skin:  Negative for pallor and rash.  Allergic/Immunologic: Negative for environmental allergies.  Neurological:  Negative for dizziness, tremors, syncope, weakness, light-headedness, numbness and headaches.  Lateral lower R leg- electric fleeting pain/ tingling  Hematological:  Negative for adenopathy. Does not bruise/bleed easily.  Psychiatric/Behavioral:  Negative for decreased concentration and dysphoric mood. The patient is not nervous/anxious.        Objective:   Physical Exam Constitutional:      General: She is not in acute distress.    Appearance: Normal appearance. She is normal weight. She is not ill-appearing or diaphoretic.  HENT:     Head: Normocephalic and atraumatic.  Eyes:     General: No scleral icterus.    Conjunctiva/sclera: Conjunctivae normal.     Pupils: Pupils are equal, round, and reactive to light.  Cardiovascular:     Rate and Rhythm: Normal rate and regular rhythm.     Pulses: Normal pulses.     Comments: Plus 2 DP and PT  pulses bilateral Pulmonary:     Effort: Pulmonary effort is normal. No respiratory distress.     Breath sounds: Normal breath sounds. No stridor. No wheezing or rales.  Musculoskeletal:     Cervical back: Neck supple. No tenderness.     Lumbar back: No swelling, deformity, spasms, tenderness or bony tenderness. Normal range of motion. Negative right straight leg raise test and negative left straight leg raise test.     Right lower leg: No edema.     Left lower leg: No edema.     Comments: Some mild discomfort in groin with hip external rotation  Some mild R greater trochanter tenderness   Normal rom of hips/knees and ankles  No bony tenderness   LLE - no calf bony or muscular tenderness Neg HOman's sign  No palp cord or erythema or warmth  Very well perfused   Lymphadenopathy:     Cervical: No cervical adenopathy.  Skin:    General: Skin is warm and dry.     Coloration: Skin is not pale.     Findings: No erythema or rash.  Neurological:     Mental Status: She is alert.     Sensory: No sensory deficit.     Motor: No weakness.     Gait: Gait normal.     Deep Tendon Reflexes: Reflexes normal.  Psychiatric:        Mood and Affect: Mood normal.           Assessment & Plan:   Problem List Items Addressed This Visit       Nervous and Auditory   Lateral femoral cutaneous entrapment syndrome, right - Primary    Pt has fleeting sharp/tingling/electric pains in her lateral R calf  This occurs without trigger , but has done more yoga and hiking and pedaling recently  Reviewed some stretches Will try and make note of what worsens or improves Working on wt loss also Given handout from up to date  Consider sport med visit if worse or not improvement  PT and imaging if needed as well  Commended on good fitness habits

## 2023-03-22 NOTE — Patient Instructions (Signed)
You may have a pinched nerve in your hip causing the leg symptoms   Make note if a particular exercise or yoga position makes it worse  If needed you can try an anti inflammatory medicine (like ibuprofen or aleve)   Keep stretching   Let us know if you worsen or don't improve We can set up an appt with our sport med doctor (Dr Patsy Lager) PT is an option

## 2023-03-22 NOTE — Assessment & Plan Note (Signed)
Pt has fleeting sharp/tingling/electric pains in her lateral R calf  This occurs without trigger , but has done more yoga and hiking and pedaling recently  Reviewed some stretches Will try and make note of what worsens or improves Working on wt loss also Given handout from up to date  Consider sport med visit if worse or not improvement  PT and imaging if needed as well  Commended on good fitness habits

## 2023-08-19 ENCOUNTER — Other Ambulatory Visit: Payer: Self-pay | Admitting: Family Medicine

## 2023-08-19 DIAGNOSIS — Z1212 Encounter for screening for malignant neoplasm of rectum: Secondary | ICD-10-CM

## 2023-08-19 DIAGNOSIS — Z1211 Encounter for screening for malignant neoplasm of colon: Secondary | ICD-10-CM

## 2023-10-03 ENCOUNTER — Telehealth: Payer: Self-pay | Admitting: Family Medicine

## 2023-10-03 DIAGNOSIS — E7800 Pure hypercholesterolemia, unspecified: Secondary | ICD-10-CM

## 2023-10-03 DIAGNOSIS — Z0000 Encounter for general adult medical examination without abnormal findings: Secondary | ICD-10-CM

## 2023-10-03 DIAGNOSIS — Z8639 Personal history of other endocrine, nutritional and metabolic disease: Secondary | ICD-10-CM

## 2023-10-03 NOTE — Telephone Encounter (Signed)
-----   Message from Alvina Chou sent at 09/26/2023  2:38 PM EST ----- Regarding: Lab orders for Tue, 11.19.24 Patient is scheduled for CPX labs, please order future labs, Thanks , Camelia Eng

## 2023-10-04 ENCOUNTER — Other Ambulatory Visit (INDEPENDENT_AMBULATORY_CARE_PROVIDER_SITE_OTHER): Payer: 59

## 2023-10-04 DIAGNOSIS — Z8639 Personal history of other endocrine, nutritional and metabolic disease: Secondary | ICD-10-CM | POA: Diagnosis not present

## 2023-10-04 DIAGNOSIS — Z0000 Encounter for general adult medical examination without abnormal findings: Secondary | ICD-10-CM

## 2023-10-04 DIAGNOSIS — E7800 Pure hypercholesterolemia, unspecified: Secondary | ICD-10-CM

## 2023-10-04 LAB — COMPREHENSIVE METABOLIC PANEL
ALT: 17 U/L (ref 0–35)
AST: 22 U/L (ref 0–37)
Albumin: 4.6 g/dL (ref 3.5–5.2)
Alkaline Phosphatase: 55 U/L (ref 39–117)
BUN: 14 mg/dL (ref 6–23)
CO2: 27 meq/L (ref 19–32)
Calcium: 9.8 mg/dL (ref 8.4–10.5)
Chloride: 103 meq/L (ref 96–112)
Creatinine, Ser: 0.8 mg/dL (ref 0.40–1.20)
GFR: 79.35 mL/min (ref >60.00)
Glucose, Bld: 95 mg/dL (ref 70–99)
Potassium: 4.1 meq/L (ref 3.5–5.1)
Sodium: 138 meq/L (ref 135–145)
Total Bilirubin: 0.4 mg/dL (ref 0.2–1.2)
Total Protein: 7.3 g/dL (ref 6.0–8.3)

## 2023-10-04 LAB — CBC WITH DIFFERENTIAL/PLATELET
Basophils Absolute: 0.1 10*3/uL (ref 0.0–0.1)
Basophils Relative: 1.2 % (ref 0.0–3.0)
Eosinophils Absolute: 0.1 10*3/uL (ref 0.0–0.7)
Eosinophils Relative: 1.7 % (ref 0.0–5.0)
HCT: 40.8 % (ref 36.0–46.0)
Hemoglobin: 13.5 g/dL (ref 12.0–15.0)
Lymphocytes Relative: 41.2 % (ref 12.0–46.0)
Lymphs Abs: 1.9 10*3/uL (ref 0.7–4.0)
MCHC: 33.1 g/dL (ref 30.0–36.0)
MCV: 92.4 fL (ref 78.0–100.0)
Monocytes Absolute: 0.3 10*3/uL (ref 0.1–1.0)
Monocytes Relative: 6.6 % (ref 3.0–12.0)
Neutro Abs: 2.2 10*3/uL (ref 1.4–7.7)
Neutrophils Relative %: 49.3 % (ref 43.0–77.0)
Platelets: 251 10*3/uL (ref 150.0–400.0)
RBC: 4.42 Mil/uL (ref 3.87–5.11)
RDW: 13.7 % (ref 11.5–15.5)
WBC: 4.5 10*3/uL (ref 4.0–10.5)

## 2023-10-04 LAB — LIPID PANEL
Cholesterol: 319 mg/dL — ABNORMAL HIGH (ref 0–200)
HDL: 50.9 mg/dL (ref >39.00)
LDL Cholesterol: 234 mg/dL — ABNORMAL HIGH (ref 0–99)
NonHDL: 267.7
Total CHOL/HDL Ratio: 6
Triglycerides: 169 mg/dL — ABNORMAL HIGH (ref 0.0–149.0)
VLDL: 33.8 mg/dL (ref 0.0–40.0)

## 2023-10-04 LAB — T4, FREE: Free T4: 0.93 ng/dL (ref 0.60–1.60)

## 2023-10-04 LAB — TSH: TSH: 2.18 u[IU]/mL (ref 0.35–5.50)

## 2023-10-10 NOTE — Progress Notes (Unsigned)
Subjective:    Patient ID: Leslie Shepard, female    DOB: 1961-04-16, 62 y.o.   MRN: [id number]  HPI  Here for health maintenance exam and to review chronic medical problems   Wt Readings from Last 3 Encounters:  10/11/23 146 lb 4 oz (66.3 kg)  03/22/23 149 lb (67.6 kg)  11/03/21 156 lb 8 oz (71 kg)   25.10 kg/m  Vitals:   10/11/23 1128 10/11/23 1227  BP: (!) 150/84 (!) 145/70  Pulse: 73   Temp: 97.7 F (36.5 C)   SpO2: 99%     Immunization History  Administered Date(s) Administered   Td 04/12/2001, 10/11/2023   Tdap 02/16/2013   Zoster Recombinant(Shingrix) 10/08/2019, 12/17/2019    There are no preventive care reminders to display for this patient.  Started having some chest pressure when she exercises  Also hollow and burning feeling in chest  Has to slow down her bike and hike  Despite being in good shape    Tetanus shot - interested   HIV screen -declines   Flu shot  -declines    Mammogram-pt has declined in the past Self breast exam  Gyn health Pap normal with neg HPV 01/2019   Colon cancer screening -has declined in past  Cologuard ordered a year ago?- she declines and was upset it was sent for her  Declines colon cancer screening    Bone health   Falls- fell once hiking  Fractures-none  Supplements -none  Last vitamin D   Exercise  Hiking/ biking    Mood    10/11/2023   12:47 PM 03/22/2023   12:06 PM 01/17/2019    8:52 AM  Depression screen PHQ 2/9  Decreased Interest 0 0 0  Down, Depressed, Hopeless 0 0 0  PHQ - 2 Score 0 0 0  Altered sleeping 0 0   Tired, decreased energy 0 0   Change in appetite 0 0   Feeling bad or failure about yourself  0 0   Trouble concentrating 0 0   Moving slowly or fidgety/restless 0 0   Suicidal thoughts 0 0   PHQ-9 Score 0 0   Difficult doing work/chores Not difficult at all Not difficult at all    Having some family issues recently that make her sad but coping well  Blood pressure high on  first check  BP Readings from Last 3 Encounters:  10/11/23 (!) 145/70  03/22/23 130/70  11/03/21 118/76   Pulse Readings from Last 3 Encounters:  10/11/23 73  03/22/23 69  11/03/21 70    Past history of thyroiditis Lab Results  Component Value Date   TSH 2.18 10/04/2023    Cholesterol Lab Results  Component Value Date   CHOL 319 (H) 10/04/2023   CHOL 238 (H) 01/17/2019   CHOL 255 (H) 02/16/2013   Lab Results  Component Value Date   HDL 50.90 10/04/2023   HDL 47.50 01/17/2019   HDL 60.20 02/16/2013   Lab Results  Component Value Date   LDLCALC 234 (H) 10/04/2023   LDLCALC 164 (H) 01/17/2019   LDLCALC 120 (H) 12/24/2009   Lab Results  Component Value Date   TRIG 169.0 (H) 10/04/2023   TRIG 136.0 01/17/2019   TRIG 124.0 02/16/2013   Lab Results  Component Value Date   CHOLHDL 6 10/04/2023   CHOLHDL 5 01/17/2019   CHOLHDL 4 02/16/2013   Lab Results  Component Value Date   LDLDIRECT 189.3 02/16/2013   LDLDIRECT 144.7 11/26/2008  Cholesterol is inherited  Has declined med in past   LDL is very    Lab Results  Component Value Date   ALT 17 10/04/2023   AST 22 10/04/2023   ALKPHOS 55 10/04/2023   BILITOT 0.4 10/04/2023    EKG today  NSR rte of 73  Some flattened/ neg T waves in V4,5,6     Patient Active Problem List   Diagnosis Date Noted   Chest discomfort 10/11/2023   Elevated BP without diagnosis of hypertension 10/11/2023   Lateral femoral cutaneous entrapment syndrome, 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 Talmage Nap (past)   History reviewed. No pertinent surgical history. Social History   Tobacco Use   Smoking status:  Never   Smokeless tobacco: Never  Substance Use Topics   Alcohol use: No   Drug use: No   Family History  Problem Relation Age of Onset   Hyperlipidemia Mother    Seizures Father        epilepsy   Breast cancer Maternal Aunt        M 1/2 Aunt ?   Depression Other        grandparents   Other Sister        carotid stenosis, smoker   Hyperlipidemia Sister    Bulemia Sister    Allergies  Allergen Reactions   Statins     Malaise and muscle pain    No current outpatient medications on file prior to visit.   No current facility-administered medications on file prior to visit.    Review of Systems  Constitutional:  Negative for activity change, appetite change, fatigue, fever and unexpected weight change.  HENT:  Negative for congestion, ear pain, rhinorrhea, sinus pressure and sore throat.   Eyes:  Negative for pain, redness and visual disturbance.  Respiratory:  Positive for chest tightness. Negative for cough, shortness of breath and wheezing.   Cardiovascular:  Positive for chest pain. Negative for palpitations and leg swelling.  Gastrointestinal:  Negative for abdominal pain, blood in stool, constipation and diarrhea.  Endocrine: Negative for polydipsia and polyuria.  Genitourinary:  Negative for dysuria, frequency and urgency.  Musculoskeletal:  Negative for arthralgias, back pain and myalgias.  Skin:  Negative for pallor and rash.  Allergic/Immunologic: Negative for environmental allergies.  Neurological:  Negative for dizziness, syncope and headaches.  Hematological:  Negative for adenopathy. Does not bruise/bleed easily.  Psychiatric/Behavioral:  Negative for decreased concentration and dysphoric mood. The patient is not nervous/anxious.        Some family stressors        Objective:   Physical Exam Constitutional:      General: She is not in acute distress.    Appearance: Normal appearance. She is well-developed and normal weight. She is not ill-appearing or  diaphoretic.  HENT:     Head: Normocephalic and atraumatic.     Right Ear: Tympanic membrane, ear canal and external ear normal.     Left Ear: Tympanic membrane, ear canal and external ear normal.     Nose: Nose normal. No congestion.     Mouth/Throat:     Mouth: Mucous membranes are moist.     Pharynx: Oropharynx is clear. No  posterior oropharyngeal erythema.  Eyes:     General: No scleral icterus.    Extraocular Movements: Extraocular movements intact.     Conjunctiva/sclera: Conjunctivae normal.     Pupils: Pupils are equal, round, and reactive to light.  Neck:     Thyroid: No thyromegaly.     Vascular: No carotid bruit or JVD.  Cardiovascular:     Rate and Rhythm: Normal rate and regular rhythm.     Pulses: Normal pulses.     Heart sounds: Normal heart sounds.     No gallop.  Pulmonary:     Effort: Pulmonary effort is normal. No respiratory distress.     Breath sounds: Normal breath sounds. No stridor. No wheezing or rhonchi.     Comments: Good air exch Chest:     Chest wall: No tenderness.  Abdominal:     General: Bowel sounds are normal. There is no distension or abdominal bruit.     Palpations: Abdomen is soft. There is no mass.     Tenderness: There is no abdominal tenderness.     Hernia: No hernia is present.  Genitourinary:    Comments: Breast exam: No mass, nodules, thickening, tenderness, bulging, retraction, inflamation, nipple discharge or skin changes noted.  No axillary or clavicular LA.     Musculoskeletal:        General: No tenderness. Normal range of motion.     Cervical back: Normal range of motion and neck supple. No rigidity. No muscular tenderness.     Right lower leg: No edema.     Left lower leg: No edema.     Comments: No kyphosis   Lymphadenopathy:     Cervical: No cervical adenopathy.  Skin:    General: Skin is warm and dry.     Coloration: Skin is not pale.     Findings: No erythema or rash.     Comments: Solar lentigines diffusely    Neurological:     Mental Status: She is alert. Mental status is at baseline.     Cranial Nerves: No cranial nerve deficit.     Motor: No abnormal muscle tone.     Coordination: Coordination normal.     Gait: Gait normal.     Deep Tendon Reflexes: Reflexes are normal and symmetric. Reflexes normal.  Psychiatric:        Mood and Affect: Mood normal.        Cognition and Memory: Cognition and memory normal.           Assessment & Plan:   Problem List Items Addressed This Visit       Other   Routine general medical examination at a health care facility - Primary    Reviewed health habits including diet and exercise and skin cancer prevention Reviewed appropriate screening tests for age  Also reviewed health mt list, fam hx and immunization status , as well as social and family history   See HPI Labs reviewed and ordered Pt declines flu shot  Td given Declines colon and breast cancer screening  Pap utd neg with neg hpv 01/2019 Discussed fall prevention, supplements and exercise for bone density   PHQ 0  Health Maintenance  Topic Date Due   Flu Shot  02/13/2024*   Mammogram  10/10/2024*   COVID-19 Vaccine (1 - 2023-24 season) 10/26/2025*   HIV Screening  10/10/2026*   Colon Cancer Screening  10/10/2028*   Pap with HPV screening  01/17/2024   DTaP/Tdap/Td vaccine (4 - Td or  Tdap) 10/10/2033   Hepatitis C Screening  Completed   Zoster (Shingles) Vaccine  Completed   HPV Vaccine  Aged Out  *Topic was postponed. The date shown is not the original due date.         Hyperlipidemia    Disc goals for lipids and reasons to control them Rev last labs with pt Rev low sat fat diet in detail Despite excellent diet LDL continues to rise (234) and I suspect this is genetic   Pt has not tolerated statins in past but is open to trial of crestor  Will try 10 mg and see if tolerated first and re check 6 wk  May be candidate for pcyk9 in future given severely high LDL  Also  having exertional chest symptoms so urgent cardiology referral done as well        Relevant Medications   rosuvastatin (CRESTOR) 10 MG tablet   Other Relevant Orders   EKG 12-Lead (Completed)   Ambulatory referral to Cardiology   History of thyroiditis    Normal TSH otday  Lab Results  Component Value Date   TSH 2.18 10/04/2023         Elevated BP without diagnosis of hypertension    Pt is anxious today  Blood pressure was better on 2nd check BP: (!) 145/70  Good habits   Was referred to cardiology for chest symptoms If still elevated there will need to return to discuss possible HTN      Relevant Orders   EKG 12-Lead (Completed)   Chest discomfort    During exertion/better with rest Worrisome for angina EKG today noted flattening of T waves in V4,5,6  No symptoms today  Reassuring exam  Has extremely high LDL- she is open to trying a different statin  Urgent referral made to cardiology for further eval Instructed pt to avoid exercise that causes symptoms  Call back and Er precautions noted in detail today         Relevant Orders   EKG 12-Lead (Completed)   Ambulatory referral to Cardiology   Other Visit Diagnoses     Need for Td vaccine       Relevant Orders   Td : Tetanus/diphtheria >7yo Preservative  free (Completed)

## 2023-10-11 ENCOUNTER — Ambulatory Visit (INDEPENDENT_AMBULATORY_CARE_PROVIDER_SITE_OTHER): Payer: 59 | Admitting: Family Medicine

## 2023-10-11 ENCOUNTER — Encounter: Payer: Self-pay | Admitting: Family Medicine

## 2023-10-11 VITALS — BP 145/70 | HR 73 | Temp 97.7°F | Ht 64.0 in | Wt 146.2 lb

## 2023-10-11 DIAGNOSIS — E7800 Pure hypercholesterolemia, unspecified: Secondary | ICD-10-CM

## 2023-10-11 DIAGNOSIS — R030 Elevated blood-pressure reading, without diagnosis of hypertension: Secondary | ICD-10-CM | POA: Insufficient documentation

## 2023-10-11 DIAGNOSIS — Z0000 Encounter for general adult medical examination without abnormal findings: Secondary | ICD-10-CM | POA: Diagnosis not present

## 2023-10-11 DIAGNOSIS — Z8639 Personal history of other endocrine, nutritional and metabolic disease: Secondary | ICD-10-CM

## 2023-10-11 DIAGNOSIS — R0789 Other chest pain: Secondary | ICD-10-CM | POA: Diagnosis not present

## 2023-10-11 DIAGNOSIS — Z23 Encounter for immunization: Secondary | ICD-10-CM | POA: Diagnosis not present

## 2023-10-11 MED ORDER — ROSUVASTATIN CALCIUM 10 MG PO TABS: 10.0000 mg | ORAL_TABLET | Freq: Every day | ORAL | 1 refills | Status: DC

## 2023-10-11 NOTE — Patient Instructions (Addendum)
Try to get 1200-1500 mg of calcium per day with at least 2000 iu of vitamin D - for bone health If calcium constipates you then just take the vitamin D    I put the referral in for cardiology  Please let us know if you don't hear in 1-2 weeks   Start crestor 10 mg each evening with a low fat snack If any intolerable side effects or concerns stop it and let us know   Let's schedule fasting labs in approx 6 weeks   Take care of yourself  Keep eating healthy   If chest discomfort gets worse or persists when resting go to the ER

## 2023-10-11 NOTE — Assessment & Plan Note (Signed)
Normal TSH otday  Lab Results  Component Value Date   TSH 2.18 10/04/2023

## 2023-10-11 NOTE — Assessment & Plan Note (Signed)
Disc goals for lipids and reasons to control them Rev last labs with pt Rev low sat fat diet in detail Despite excellent diet LDL continues to rise (234) and I suspect this is genetic   Pt has not tolerated statins in past but is open to trial of crestor  Will try 10 mg and see if tolerated first and re check 6 wk  May be candidate for pcyk9 in future given severely high LDL  Also having exertional chest symptoms so urgent cardiology referral done as well

## 2023-10-11 NOTE — Assessment & Plan Note (Signed)
Pt is anxious today  Blood pressure was better on 2nd check BP: (!) 145/70  Good habits   Was referred to cardiology for chest symptoms If still elevated there will need to return to discuss possible HTN

## 2023-10-11 NOTE — Assessment & Plan Note (Addendum)
Reviewed health habits including diet and exercise and skin cancer prevention Reviewed appropriate screening tests for age  Also reviewed health mt list, fam hx and immunization status , as well as social and family history   See HPI Labs reviewed and ordered Pt declines flu shot  Td given Declines colon and breast cancer screening  Pap utd neg with neg hpv 01/2019 Discussed fall prevention, supplements and exercise for bone density   PHQ 0  Health Maintenance  Topic Date Due   Flu Shot  02/13/2024*   Mammogram  10/10/2024*   COVID-19 Vaccine (1 - 2023-24 season) 10/26/2025*   HIV Screening  10/10/2026*   Colon Cancer Screening  10/10/2028*   Pap with HPV screening  01/17/2024   DTaP/Tdap/Td vaccine (4 - Td or Tdap) 10/10/2033   Hepatitis C Screening  Completed   Zoster (Shingles) Vaccine  Completed   HPV Vaccine  Aged Out  *Topic was postponed. The date shown is not the original due date.

## 2023-10-11 NOTE — Assessment & Plan Note (Signed)
During exertion/better with rest Worrisome for angina EKG today noted flattening of T waves in V4,5,6  No symptoms today  Reassuring exam  Has extremely high LDL- she is open to trying a different statin  Urgent referral made to cardiology for further eval Instructed pt to avoid exercise that causes symptoms  Call back and Er precautions noted in detail today

## 2023-10-11 NOTE — Addendum Note (Signed)
Addended by: Shon Millet on: 10/11/2023 02:19 PM   Modules accepted: Orders

## 2023-10-17 ENCOUNTER — Encounter: Payer: Self-pay | Admitting: Cardiovascular Disease

## 2023-10-17 ENCOUNTER — Ambulatory Visit: Payer: 59 | Attending: Cardiovascular Disease | Admitting: Cardiovascular Disease

## 2023-10-17 VITALS — BP 168/88 | HR 87 | Ht 64.0 in | Wt 144.0 lb

## 2023-10-17 DIAGNOSIS — R079 Chest pain, unspecified: Secondary | ICD-10-CM

## 2023-10-17 DIAGNOSIS — R0789 Other chest pain: Secondary | ICD-10-CM | POA: Diagnosis not present

## 2023-10-17 MED ORDER — METOPROLOL TARTRATE 100 MG PO TABS: ORAL_TABLET | ORAL | 0 refills | Status: DC

## 2023-10-17 NOTE — Patient Instructions (Addendum)
Medication Instructions:  Your physician recommends that you continue on your current medications as directed. Please refer to the Current Medication list given to you today.  *If you need a refill on your cardiac medications before your next appointment, please call your pharmacy*   Lab Work: None ordered   If you have labs (blood work) drawn today and your tests are completely normal, you will receive your results only by: MyChart Message (if you have MyChart) OR A paper copy in the mail If you have any lab test that is abnormal or we need to change your treatment, we will call you to review the results.   Testing/Procedures: Your physician has requested that you have an echocardiogram. Echocardiography is a painless test that uses sound waves to create images of your heart. It provides your doctor with information about the size and shape of your heart and how well your heart's chambers and valves are working. This procedure takes approximately one hour. There are no restrictions for this procedure. Please do NOT wear cologne, perfume, aftershave, or lotions (deodorant is allowed). Please arrive 15 minutes prior to your appointment time.  Please note: We ask at that you not bring children with you during ultrasound (echo/ vascular) testing. Due to room size and safety concerns, children are not allowed in the ultrasound rooms during exams. Our front office staff cannot provide observation of children in our lobby area while testing is being conducted. An adult accompanying a patient to their appointment will only be allowed in the ultrasound room at the discretion of the ultrasound technician under special circumstances. We apologize for any inconvenience.   Your physician has requested that you have a coronary CTA. Please refer to instructions given.    Follow-Up: At [facility] you and your health needs are our priority.  As part of our continuing mission to provide you  with exceptional heart care, we have created designated Provider Care Teams.  These Care Teams include your primary Cardiologist (physician) and Advanced Practice Providers (APPs -  Physician Assistants and Nurse Practitioners) who all work together to provide you with the care you need, when you need it.  We recommend signing up for the patient portal called "MyChart".  Sign up information is provided on this After Visit Summary.  MyChart is used to connect with patients for Virtual Visits (Telemedicine).  Patients are able to view lab/test results, encounter notes, upcoming appointments, etc.  Non-urgent messages can be sent to your provider as well.   To learn more about what you can do with MyChart, go to [email or web address].    Your next appointment:   6-8 week(s)  Provider:   Verne Carrow, MD     Other Instructions   Your cardiac CT will be scheduled at one of the below locations:   [facility] [address] [place], [place] [address] [phone]  If scheduled at [facility], please arrive at the [facility] and Children's Entrance (Entrance C2) of [facility] 30 minutes prior to test start time. You can use the FREE valet parking offered at entrance C (encouraged to control the heart rate for the test)  Proceed to the [facility] Radiology Department (first floor) to check-in and test prep.  All radiology patients and guests should use entrance C2 at [facility], accessed from [facility], even though the hospital's physical address listed is [address]      Please follow these instructions carefully (unless  otherwise directed):  An IV will be required for this test and Nitroglycerin will be given.   On the Night Before the Test: Be sure to Drink plenty of water. Do not consume any caffeinated/decaffeinated beverages or chocolate 12 hours prior to your test. Do not take any antihistamines 12 hours  prior to your test.  On the Day of the Test: Drink plenty of water until 1 hour prior to the test. Do not eat any food 1 hour prior to test. You may take your regular medications prior to the test.  Take metoprolol (Lopressor) two hours prior to test. FEMALES- please wear underwire-free bra if available, avoid dresses & tight clothing      After the Test: Drink plenty of water. After receiving IV contrast, you may experience a mild flushed feeling. This is normal. On occasion, you may experience a mild rash up to 24 hours after the test. This is not dangerous. If this occurs, you can take Benadryl 25 mg and increase your fluid intake. If you experience trouble breathing, this can be serious. If it is severe call 911 IMMEDIATELY. If it is mild, please call our office.  We will call to schedule your test 2-4 weeks out understanding that some insurance companies will need an authorization prior to the service being performed.   For more information and frequently asked questions, please visit our website : [email or web address]  For non-scheduling related questions, please contact the cardiac imaging nurse navigator should you have any questions/concerns: Cardiac Imaging Nurse Navigators Direct Office Dial: [phone]   For scheduling needs, including cancellations and rescheduling, please call [place], [phone].

## 2023-10-17 NOTE — Progress Notes (Signed)
Chief Complaint  Patient presents with   New Patient (Initial Visit)    Chest pain    History of Present Illness: 62 yo female with history of HLD who is here today as a new patient for the evaluation of chest pain. She has not had any prior cardiac issues. EKG 10/11/23 with sinus, T wave abnormality. She tells me today that she has been having chest pressure at peak exertion. She is very active. She notices the chest pressure when she is riding the stationary bike. She also has dyspnea with exertion.  This resolves with rest.   Primary Care Physician: Tower, Audrie Gallus, MD   Past Medical History:  Diagnosis Date   Hyperlipidemia    PMDD (premenstrual dysphoric disorder)    Thyroiditis 2006   Endocrine-Dr. Talmage Nap (past)    Past Surgical History:  Procedure Laterality Date   none      Current Outpatient Medications  Medication Sig Dispense Refill   metoprolol tartrate (LOPRESSOR) 100 MG tablet Take 1 tablet by mouth 2 hours before CT scan 1 tablet 0   rosuvastatin (CRESTOR) 10 MG tablet Take 1 tablet (10 mg total) by mouth daily. In evening with a low fat snack 30 tablet 1   No current facility-administered medications for this visit.    Allergies  Allergen Reactions   Statins     Malaise and muscle pain     Social History   Socioeconomic History   Marital status: Married    Spouse name: Not on file   Number of children: 2   Years of education: Not on file   Highest education level: Not on file  Occupational History   Occupation: Works in rental houses  Tobacco Use   Smoking status: Never   Smokeless tobacco: Never  Substance and Sexual Activity   Alcohol use: Yes    Comment: 1-2 drinks per week   Drug use: No   Sexual activity: Yes    Birth control/protection: Post-menopausal  Other Topics Concern   Not on file  Social History Narrative   Last updated: 11/13/2008   Never Smoked   Alcohol use-yes/ occasional   Drug use-no   exercise- running/ biking           Social Determinants of Health   Financial Resource Strain: Patient Declined (10/11/2023)   Overall Financial Resource Strain (CARDIA)    Difficulty of Paying Living Expenses: Patient declined  Food Insecurity: Patient Declined (10/11/2023)   Hunger Vital Sign    Worried About Running Out of Food in the Last Year: Patient declined    Ran Out of Food in the Last Year: Patient declined  Transportation Needs: Patient Declined (10/11/2023)   PRAPARE - Administrator, Civil Service (Medical): Patient declined    Lack of Transportation (Non-Medical): Patient declined  Physical Activity: Sufficiently Active (10/11/2023)   Exercise Vital Sign    Days of Exercise per Week: 4 days    Minutes of Exercise per Session: 50 min  Stress: No Stress Concern Present (10/11/2023)   [employer] of Occupational Health - Occupational Stress Questionnaire    Feeling of Stress : Not at all  Social Connections: Unknown (10/11/2023)   Social Connection and Isolation Panel [NHANES]    Frequency of Communication with Friends and Family: Patient declined    Frequency of Social Gatherings with Friends and Family: Patient declined    Attends Religious Services: Patient declined    Database administrator or Organizations: Patient  declined    Attends Banker Meetings: Not on file    Marital Status: Married  Catering manager Violence: Not on file    Family History  Problem Relation Age of Onset   Hyperlipidemia Mother    Seizures Father        epilepsy   Other Sister        carotid stenosis, smoker   Hyperlipidemia Sister    Bulemia Sister    Breast cancer Maternal Aunt        M 1/2 Aunt ?   Depression Other        grandparents    Review of Systems:  As stated in the HPI and otherwise negative.   BP (!) 168/88   Pulse 87   Ht 5\' 4"  (1.626 m)   Wt 65.3 kg   SpO2 98%   BMI 24.72 kg/m   Physical Examination: General: Well developed, well nourished, NAD   HEENT: OP clear, mucus membranes moist  SKIN: warm, dry. No rashes. Neuro: No focal deficits  Musculoskeletal: Muscle strength 5/5 all ext  Psychiatric: Mood and affect normal  Neck: No JVD, no carotid bruits, no thyromegaly, no lymphadenopathy.  Lungs:Clear bilaterally, no wheezes, rhonci, crackles Cardiovascular: Regular rate and rhythm. No murmurs, gallops or rubs. Abdomen:Soft. Bowel sounds present. Non-tender.  Extremities: No lower extremity edema. Pulses are 2 + in the bilateral DP/PT.  EKG:  EKG is not ordered today. The ekg ordered today demonstrates  EKG from 10/11/23 with sinus, non-specific T wave abnormality  Recent Labs: 10/04/2023: ALT 17; BUN 14; Creatinine, Ser 0.80; Hemoglobin 13.5; Platelets 251.0; Potassium 4.1; Sodium 138; TSH 2.18   Lipid Panel    Component Value Date/Time   CHOL 319 (H) 10/04/2023 0848   TRIG 169.0 (H) 10/04/2023 0848   HDL 50.90 10/04/2023 0848   CHOLHDL 6 10/04/2023 0848   VLDL 33.8 10/04/2023 0848   LDLCALC 234 (H) 10/04/2023 0848   LDLDIRECT 189.3 02/16/2013 1128     Wt Readings from Last 3 Encounters:  10/17/23 65.3 kg  10/11/23 66.3 kg  03/22/23 67.6 kg    Assessment and Plan:   1. Chest pain: She is having exertional chest pain/pressure and dyspnea. Her only risk factors for CAD are HLD, age. I will arrange a coronary CTA to exclude CAD. Echo to assess LV systolic function and exclude structural heart disease. She is now on a statin. Lipids followed in primary care.   Labs/ tests ordered today include:   Orders Placed This Encounter  Procedures   CT CORONARY MORPH W/CTA COR W/SCORE W/CA W/CM &/OR WO/CM   ECHOCARDIOGRAM COMPLETE     Disposition:   F/U with me in 6-8 weeks    Signed, Verne Carrow, MD, [facility] 10/17/2023 2:09 PM    [facility] [address] [place], [place], [place]  [address] Phone: [phone]; Fax: [phone]

## 2023-11-23 ENCOUNTER — Other Ambulatory Visit: Payer: 59

## 2023-11-28 ENCOUNTER — Ambulatory Visit (HOSPITAL_COMMUNITY): Payer: 59

## 2023-11-30 ENCOUNTER — Ambulatory Visit: Payer: 59 | Admitting: Cardiovascular Disease

## 2024-01-10 ENCOUNTER — Inpatient Hospital Stay
Admission: EM | Admit: 2024-01-10 | Discharge: 2024-01-14 | DRG: 321 | Disposition: A | Discharge status: Home / Self Care | Payer: 59 | Attending: Internal Medicine | Admitting: Internal Medicine

## 2024-01-10 ENCOUNTER — Inpatient Hospital Stay (HOSPITAL_COMMUNITY)
Admit: 2024-01-10 | Discharge: 2024-01-10 | Disposition: A | Discharge status: Home / Self Care | Payer: 59 | Attending: Internal Medicine | Admitting: Internal Medicine

## 2024-01-10 ENCOUNTER — Other Ambulatory Visit: Payer: Self-pay

## 2024-01-10 ENCOUNTER — Encounter
Admission: EM | Disposition: A | Discharge status: Home / Self Care | Payer: Self-pay | Source: Home / Self Care | Attending: Internal Medicine

## 2024-01-10 ENCOUNTER — Emergency Department: Payer: 59

## 2024-01-10 DIAGNOSIS — I493 Ventricular premature depolarization: Secondary | ICD-10-CM | POA: Diagnosis not present

## 2024-01-10 DIAGNOSIS — I2102 ST elevation (STEMI) myocardial infarction involving left anterior descending coronary artery: Secondary | ICD-10-CM | POA: Diagnosis not present

## 2024-01-10 DIAGNOSIS — Q245 Malformation of coronary vessels: Secondary | ICD-10-CM

## 2024-01-10 DIAGNOSIS — I4901 Ventricular fibrillation: Secondary | ICD-10-CM | POA: Diagnosis not present

## 2024-01-10 DIAGNOSIS — R001 Bradycardia, unspecified: Secondary | ICD-10-CM | POA: Diagnosis present

## 2024-01-10 DIAGNOSIS — S5011XA Contusion of right forearm, initial encounter: Secondary | ICD-10-CM | POA: Diagnosis present

## 2024-01-10 DIAGNOSIS — E782 Mixed hyperlipidemia: Secondary | ICD-10-CM | POA: Diagnosis not present

## 2024-01-10 DIAGNOSIS — I213 ST elevation (STEMI) myocardial infarction of unspecified site: Secondary | ICD-10-CM | POA: Diagnosis not present

## 2024-01-10 DIAGNOSIS — E785 Hyperlipidemia, unspecified: Secondary | ICD-10-CM | POA: Diagnosis not present

## 2024-01-10 DIAGNOSIS — E876 Hypokalemia: Secondary | ICD-10-CM | POA: Diagnosis not present

## 2024-01-10 DIAGNOSIS — I249 Acute ischemic heart disease, unspecified: Secondary | ICD-10-CM | POA: Diagnosis not present

## 2024-01-10 DIAGNOSIS — E7801 Familial hypercholesterolemia: Secondary | ICD-10-CM

## 2024-01-10 DIAGNOSIS — Z7984 Long term (current) use of oral hypoglycemic drugs: Secondary | ICD-10-CM

## 2024-01-10 DIAGNOSIS — I259 Chronic ischemic heart disease, unspecified: Secondary | ICD-10-CM

## 2024-01-10 DIAGNOSIS — I472 Ventricular tachycardia, unspecified: Secondary | ICD-10-CM | POA: Diagnosis not present

## 2024-01-10 DIAGNOSIS — Z888 Allergy status to other drugs, medicaments and biological substances status: Secondary | ICD-10-CM | POA: Diagnosis not present

## 2024-01-10 DIAGNOSIS — I255 Ischemic cardiomyopathy: Secondary | ICD-10-CM

## 2024-01-10 DIAGNOSIS — I462 Cardiac arrest due to underlying cardiac condition: Secondary | ICD-10-CM | POA: Diagnosis not present

## 2024-01-10 DIAGNOSIS — E871 Hypo-osmolality and hyponatremia: Secondary | ICD-10-CM | POA: Diagnosis not present

## 2024-01-10 DIAGNOSIS — R9431 Abnormal electrocardiogram [ECG] [EKG]: Secondary | ICD-10-CM | POA: Diagnosis present

## 2024-01-10 DIAGNOSIS — Z79899 Other long term (current) drug therapy: Secondary | ICD-10-CM | POA: Diagnosis not present

## 2024-01-10 DIAGNOSIS — R7989 Other specified abnormal findings of blood chemistry: Secondary | ICD-10-CM | POA: Diagnosis not present

## 2024-01-10 DIAGNOSIS — X58XXXA Exposure to other specified factors, initial encounter: Secondary | ICD-10-CM | POA: Diagnosis not present

## 2024-01-10 DIAGNOSIS — E8721 Acute metabolic acidosis: Secondary | ICD-10-CM | POA: Diagnosis not present

## 2024-01-10 DIAGNOSIS — I5021 Acute systolic (congestive) heart failure: Secondary | ICD-10-CM | POA: Diagnosis not present

## 2024-01-10 DIAGNOSIS — I4721 Torsades de pointes: Secondary | ICD-10-CM | POA: Diagnosis not present

## 2024-01-10 DIAGNOSIS — N179 Acute kidney failure, unspecified: Secondary | ICD-10-CM | POA: Insufficient documentation

## 2024-01-10 DIAGNOSIS — Z955 Presence of coronary angioplasty implant and graft: Secondary | ICD-10-CM | POA: Diagnosis not present

## 2024-01-10 DIAGNOSIS — I959 Hypotension, unspecified: Secondary | ICD-10-CM | POA: Diagnosis not present

## 2024-01-10 DIAGNOSIS — Z83438 Family history of other disorder of lipoprotein metabolism and other lipidemia: Secondary | ICD-10-CM

## 2024-01-10 DIAGNOSIS — I2510 Atherosclerotic heart disease of native coronary artery without angina pectoris: Secondary | ICD-10-CM | POA: Diagnosis not present

## 2024-01-10 DIAGNOSIS — R079 Chest pain, unspecified: Secondary | ICD-10-CM

## 2024-01-10 DIAGNOSIS — I2119 ST elevation (STEMI) myocardial infarction involving other coronary artery of inferior wall: Secondary | ICD-10-CM | POA: Diagnosis not present

## 2024-01-10 DIAGNOSIS — I469 Cardiac arrest, cause unspecified: Secondary | ICD-10-CM | POA: Diagnosis not present

## 2024-01-10 DIAGNOSIS — R072 Precordial pain: Secondary | ICD-10-CM | POA: Diagnosis not present

## 2024-01-10 HISTORY — PX: LEFT HEART CATH AND CORONARY ANGIOGRAPHY: CATH118249

## 2024-01-10 HISTORY — PX: CORONARY/GRAFT ACUTE MI REVASCULARIZATION: CATH118305

## 2024-01-10 LAB — HEPATIC FUNCTION PANEL
ALT: 51 U/L — ABNORMAL HIGH (ref 0–44)
AST: 232 U/L — ABNORMAL HIGH (ref 15–41)
Albumin: 4.1 g/dL (ref 3.5–5.0)
Alkaline Phosphatase: 46 U/L (ref 38–126)
Bilirubin, Direct: <0.1 mg/dL (ref 0.0–0.2)
Total Bilirubin: 1.1 mg/dL (ref 0.0–1.2)
Total Protein: 6.3 g/dL — ABNORMAL LOW (ref 6.5–8.1)

## 2024-01-10 LAB — CBC
HCT: 39.9 % (ref 36.0–46.0)
Hemoglobin: 13.4 g/dL (ref 12.0–15.0)
MCH: 30.7 pg (ref 26.0–34.0)
MCHC: 33.6 g/dL (ref 30.0–36.0)
MCV: 91.5 fL (ref 80.0–100.0)
Platelets: 233 10*3/uL (ref 150–400)
RBC: 4.36 MIL/uL (ref 3.87–5.11)
RDW: 13.3 % (ref 11.5–15.5)
WBC: 6.2 10*3/uL (ref 4.0–10.5)
nRBC: 0 % (ref 0.0–0.2)

## 2024-01-10 LAB — BASIC METABOLIC PANEL
Anion gap: 11 (ref 5–15)
BUN: 15 mg/dL (ref 8–23)
CO2: 23 mmol/L (ref 22–32)
Calcium: 10.1 mg/dL (ref 8.9–10.3)
Chloride: 102 mmol/L (ref 98–111)
Creatinine, Ser: 0.72 mg/dL (ref 0.44–1.00)
GFR, Estimated: >60 mL/min (ref >60)
Glucose, Bld: 120 mg/dL — ABNORMAL HIGH (ref 70–99)
Potassium: 3.6 mmol/L (ref 3.5–5.1)
Sodium: 136 mmol/L (ref 135–145)

## 2024-01-10 LAB — TROPONIN I (HIGH SENSITIVITY)
Troponin I (High Sensitivity): 64 ng/L — ABNORMAL HIGH (ref <18)
Troponin I (High Sensitivity): 310 ng/L (ref <18)

## 2024-01-10 LAB — GLUCOSE, CAPILLARY: Glucose-Capillary: 76 mg/dL (ref 70–99)

## 2024-01-10 LAB — PROTIME-INR
INR: 1.1 (ref 0.8–1.2)
Prothrombin Time: 14.7 s (ref 11.4–15.2)

## 2024-01-10 LAB — POCT ACTIVATED CLOTTING TIME
Activated Clotting Time: 250 s
Activated Clotting Time: 262 s
Activated Clotting Time: 297 s

## 2024-01-10 LAB — CG4 I-STAT (LACTIC ACID): Lactic Acid, Venous: 0.7 mmol/L (ref 0.5–1.9)

## 2024-01-10 SURGERY — CORONARY/GRAFT ACUTE MI REVASCULARIZATION
Anesthesia: Moderate Sedation

## 2024-01-10 MED ORDER — FENTANYL CITRATE (PF) 100 MCG/2ML IJ SOLN
INTRAMUSCULAR | Status: DC | PRN
  Administered 2024-01-10 (×2): 12.5 ug via INTRAVENOUS

## 2024-01-10 MED ORDER — HEPARIN (PORCINE) 25000 UT/250ML-% IV SOLN: 650.0000 [IU]/h | INTRAVENOUS | Status: DC

## 2024-01-10 MED ORDER — ONDANSETRON HCL 4 MG/2ML IJ SOLN
4.0000 mg | Freq: Four times a day (QID) | INTRAMUSCULAR | Status: DC | PRN
  Administered 2024-01-11: 4 mg via INTRAVENOUS
  Filled 2024-01-10: qty 2

## 2024-01-10 MED ORDER — TICAGRELOR 60 MG PO TABS
ORAL_TABLET | ORAL | Status: DC | PRN
  Administered 2024-01-10: 180 mg via ORAL

## 2024-01-10 MED ORDER — ENOXAPARIN SODIUM 40 MG/0.4ML IJ SOSY
40.0000 mg | PREFILLED_SYRINGE | INTRAMUSCULAR | Status: DC
Start: 2024-01-11 — End: 2024-01-14
  Administered 2024-01-11 – 2024-01-14 (×4): 40 mg via SUBCUTANEOUS
  Filled 2024-01-10 (×4): qty 0.4

## 2024-01-10 MED ORDER — CANGRELOR BOLUS VIA INFUSION
INTRAVENOUS | Status: DC | PRN
Start: 2024-01-10 — End: 2024-01-10
  Administered 2024-01-10: 1674 ug via INTRAVENOUS

## 2024-01-10 MED ORDER — ACETAMINOPHEN 325 MG PO TABS
650.0000 mg | ORAL_TABLET | Freq: Four times a day (QID) | ORAL | Status: DC | PRN
  Filled 2024-01-10: qty 2

## 2024-01-10 MED ORDER — CANGRELOR TETRASODIUM 50 MG IV SOLR
INTRAVENOUS | Status: AC
  Filled 2024-01-10: qty 50

## 2024-01-10 MED ORDER — TICAGRELOR 90 MG PO TABS
ORAL_TABLET | ORAL | Status: AC
  Filled 2024-01-10: qty 2

## 2024-01-10 MED ORDER — ASPIRIN 81 MG PO CHEW
81.0000 mg | CHEWABLE_TABLET | Freq: Every day | ORAL | Status: DC
  Administered 2024-01-11 – 2024-01-14 (×4): 81 mg via ORAL
  Filled 2024-01-10 (×4): qty 1

## 2024-01-10 MED ORDER — ONDANSETRON HCL 4 MG PO TABS: 4.0000 mg | ORAL_TABLET | Freq: Four times a day (QID) | ORAL | Status: DC | PRN

## 2024-01-10 MED ORDER — FENTANYL CITRATE (PF) 100 MCG/2ML IJ SOLN
INTRAMUSCULAR | Status: AC
Start: 2024-01-10 — End: ?
  Filled 2024-01-10: qty 2

## 2024-01-10 MED ORDER — ORAL CARE MOUTH RINSE: 15.0000 mL | OROMUCOSAL | Status: DC | PRN

## 2024-01-10 MED ORDER — ACETAMINOPHEN 650 MG RE SUPP: 650.0000 mg | Freq: Four times a day (QID) | RECTAL | Status: DC | PRN

## 2024-01-10 MED ORDER — HEPARIN BOLUS VIA INFUSION
3350.0000 [IU] | Freq: Once | INTRAVENOUS | Status: AC
  Administered 2024-01-10: 3350 [IU] via INTRAVENOUS
  Filled 2024-01-10: qty 3350

## 2024-01-10 MED ORDER — VERAPAMIL HCL 2.5 MG/ML IV SOLN
INTRAVENOUS | Status: DC | PRN
  Administered 2024-01-10 (×2): 2.5 mg via INTRAVENOUS

## 2024-01-10 MED ORDER — SODIUM CHLORIDE 0.9 % IV SOLN
4.0000 ug/kg/min | INTRAVENOUS | Status: AC
  Filled 2024-01-10: qty 50

## 2024-01-10 MED ORDER — SODIUM CHLORIDE 0.9 % IV SOLN
INTRAVENOUS | Status: AC | PRN
  Administered 2024-01-10: 20 mL/h via INTRAVENOUS

## 2024-01-10 MED ORDER — TICAGRELOR 90 MG PO TABS
90.0000 mg | ORAL_TABLET | Freq: Two times a day (BID) | ORAL | Status: AC
  Administered 2024-01-11 (×2): 90 mg via ORAL
  Filled 2024-01-10 (×2): qty 1

## 2024-01-10 MED ORDER — VERAPAMIL HCL 2.5 MG/ML IV SOLN
INTRAVENOUS | Status: AC
  Filled 2024-01-10: qty 2

## 2024-01-10 MED ORDER — SODIUM CHLORIDE 0.9 % IV SOLN: 250.0000 mL | INTRAVENOUS | Status: AC | PRN

## 2024-01-10 MED ORDER — ASPIRIN 81 MG PO CHEW
324.0000 mg | CHEWABLE_TABLET | Freq: Once | ORAL | Status: AC
  Administered 2024-01-10: 324 mg via ORAL
  Filled 2024-01-10: qty 4

## 2024-01-10 MED ORDER — HYDRALAZINE HCL 20 MG/ML IJ SOLN: 10.0000 mg | INTRAMUSCULAR | Status: AC | PRN

## 2024-01-10 MED ORDER — MORPHINE SULFATE (PF) 4 MG/ML IV SOLN: 2.0000 mg | INTRAVENOUS | Status: DC | PRN

## 2024-01-10 MED ORDER — HEPARIN (PORCINE) IN NACL 1000-0.9 UT/500ML-% IV SOLN
INTRAVENOUS | Status: AC
  Filled 2024-01-10: qty 1000

## 2024-01-10 MED ORDER — NITROGLYCERIN 1 MG/10 ML FOR IR/CATH LAB
INTRA_ARTERIAL | Status: DC | PRN
  Administered 2024-01-10 (×2): 100 ug via INTRACORONARY

## 2024-01-10 MED ORDER — MIDAZOLAM HCL 2 MG/2ML IJ SOLN
INTRAMUSCULAR | Status: AC
  Filled 2024-01-10: qty 2

## 2024-01-10 MED ORDER — TRAZODONE HCL 50 MG PO TABS: 25.0000 mg | ORAL_TABLET | Freq: Every evening | ORAL | Status: DC | PRN

## 2024-01-10 MED ORDER — HEPARIN SODIUM (PORCINE) 1000 UNIT/ML IJ SOLN
INTRAMUSCULAR | Status: AC
  Filled 2024-01-10: qty 10

## 2024-01-10 MED ORDER — IOHEXOL 300 MG/ML  SOLN
INTRAMUSCULAR | Status: DC | PRN
  Administered 2024-01-10: 120 mL

## 2024-01-10 MED ORDER — NITROGLYCERIN 0.4 MG SL SUBL
0.4000 mg | SUBLINGUAL_TABLET | Freq: Once | SUBLINGUAL | Status: AC
  Administered 2024-01-10: 0.4 mg via SUBLINGUAL
  Filled 2024-01-10: qty 1

## 2024-01-10 MED ORDER — SODIUM CHLORIDE 0.9% FLUSH: 3.0000 mL | INTRAVENOUS | Status: DC | PRN

## 2024-01-10 MED ORDER — SODIUM CHLORIDE 0.9% FLUSH
3.0000 mL | Freq: Two times a day (BID) | INTRAVENOUS | Status: DC
  Administered 2024-01-10 – 2024-01-13 (×7): 3 mL via INTRAVENOUS

## 2024-01-10 MED ORDER — HEPARIN SODIUM (PORCINE) 1000 UNIT/ML IJ SOLN
INTRAMUSCULAR | Status: DC | PRN
  Administered 2024-01-10 (×2): 2000 [IU] via INTRAVENOUS
  Administered 2024-01-10: 3000 [IU] via INTRAVENOUS

## 2024-01-10 MED ORDER — LIDOCAINE HCL 1 % IJ SOLN
INTRAMUSCULAR | Status: AC
  Filled 2024-01-10: qty 20

## 2024-01-10 MED ORDER — LIDOCAINE HCL (PF) 1 % IJ SOLN
INTRAMUSCULAR | Status: DC | PRN
  Administered 2024-01-10: 5 mL

## 2024-01-10 MED ORDER — MIDAZOLAM HCL 2 MG/2ML IJ SOLN
INTRAMUSCULAR | Status: DC | PRN
  Administered 2024-01-10 (×2): .5 mg via INTRAVENOUS

## 2024-01-10 MED ORDER — SODIUM CHLORIDE 0.9 % IV SOLN: INTRAVENOUS | Status: DC

## 2024-01-10 MED ORDER — HEPARIN (PORCINE) IN NACL 2000-0.9 UNIT/L-% IV SOLN
INTRAVENOUS | Status: DC | PRN
  Administered 2024-01-10: 1000 mL

## 2024-01-10 MED ORDER — SODIUM CHLORIDE 0.9 % IV SOLN
INTRAVENOUS | Status: AC | PRN
  Administered 2024-01-10: 4 ug/kg/min via INTRAVENOUS

## 2024-01-10 SURGICAL SUPPLY — 20 items
BALLN TREK RX 2.25X12 (BALLOONS) ×1 IMPLANT
BALLN ~~LOC~~ TREK NEO RX 3.25X12 (BALLOONS) ×1 IMPLANT
BALLOON TREK RX 2.25X12 (BALLOONS) IMPLANT
BALLOON ~~LOC~~ TREK NEO RX 3.25X12 (BALLOONS) IMPLANT
CATH INFINITI JR4 5F (CATHETERS) IMPLANT
CATH LAUNCHER 6FR EBU3.5 (CATHETERS) IMPLANT
CATH VISTA GUIDE 6FR XB3 MULPK (CATHETERS) IMPLANT
DEVICE RAD TR BAND REGULAR (VASCULAR PRODUCTS) IMPLANT
DRAPE BRACHIAL (DRAPES) IMPLANT
GLIDESHEATH SLEND SS 6F .021 (SHEATH) IMPLANT
GUIDEWIRE INQWIRE 1.5J.035X260 (WIRE) IMPLANT
INQWIRE 1.5J .035X260CM (WIRE) ×1 IMPLANT
KIT ENCORE 26 ADVANTAGE (KITS) IMPLANT
PACK CARDIAC CATH (CUSTOM PROCEDURE TRAY) ×2 IMPLANT
PROTECTION STATION PRESSURIZED (MISCELLANEOUS) ×1 IMPLANT
SET ATX-X65L (MISCELLANEOUS) IMPLANT
STATION PROTECTION PRESSURIZED (MISCELLANEOUS) IMPLANT
STENT ONYX FRONTIER 2.75X15 (Permanent Stent) IMPLANT
STENT ONYX FRONTIER 2.75X18 (Permanent Stent) IMPLANT
WIRE RUNTHROUGH .014X180CM (WIRE) IMPLANT

## 2024-01-10 NOTE — ED Notes (Signed)
 Cardiology at bedside.

## 2024-01-10 NOTE — ED Notes (Signed)
 Pt to cath lab.

## 2024-01-10 NOTE — Progress Notes (Signed)
 PHARMACY - ANTICOAGULATION CONSULT NOTE  Pharmacy Consult for Heparin Infusion Indication: chest pain/ACS  Allergies  Allergen Reactions   Statins     Malaise and muscle pain     Patient Measurements: Height: 5\' 4"  (162.6 cm) Weight: 55.8 kg (123 lb) IBW/kg (Calculated) : 54.7 Heparin Dosing Weight: 55.8 kg  Vital Signs: Temp: 97.8 F (36.6 C) (02/25 1311) Temp Source: Oral (02/25 1311) BP: 142/91 (02/25 1415) Pulse Rate: 70 (02/25 1415)  Labs: Recent Labs    01/10/24 1311  HGB 13.4  HCT 39.9  PLT 233  CREATININE 0.72  TROPONINIHS 64*    Estimated Creatinine Clearance: 63 mL/min (by C-G formula based on SCr of 0.72 mg/dL).   Medical History: Past Medical History:  Diagnosis Date   Hyperlipidemia    PMDD (premenstrual dysphoric disorder)     Medications:  Not on anticoagulation at home per chart review  Assessment: Patient is a 63 year old female with a past medical history of hyperlipidemia who presented to ED with mid sternal chest pain that radiates down both arms. Troponin elevated at 64. Pharmacy was consulted to initiate patient on a heparin infusion for ACS/STEMI.  Baseline INR and aPTT ordered.  No signs/symptoms of bleeding noted in chart. Hgb 13.4. PLT 233.  Goal of Therapy:  Heparin level 0.3-0.7 units/ml Monitor platelets by anticoagulation protocol: Yes   Plan:  Give 3350 unit bolus x 1 Start heparin infusion at a rate of 650 units/hr Check heparin level in 6 hours  Monitor CBC daily while on heparin  Merryl Hacker, PharmD Clinical Pharmacist  01/10/2024,3:59 PM

## 2024-01-10 NOTE — ED Provider Notes (Signed)
 [facility] Provider Note   Event Date/Time   First MD Initiated Contact with Patient 01/10/24 1502     (approximate) History  Chest Pain  HPI Leslie Shepard is a 63 y.o. female with a past medical history of hyperlipidemia who presents complaining of midsternal chest pain that radiates down both arms and has been intermittent over the past few months.  Patient states that this pain initially occurred with riding a stationary bike however over the last 24 hours, she is experience this while at rest.  Patient states that she experienced this pain earlier today that went away however it has returned and has not remitted.  Patient does endorse exertional worsening of this pain. ROS: Patient currently denies any vision changes, tinnitus, difficulty speaking, facial droop, sore throat, shortness of breath, abdominal pain, nausea/vomiting/diarrhea, dysuria, or weakness/numbness/paresthesias in any extremity   Physical Exam  Triage Vital Signs: ED Triage Vitals  Encounter Vitals Group     BP 01/10/24 1311 139/74     Systolic BP Percentile --      Diastolic BP Percentile --      Pulse Rate 01/10/24 1311 79     Resp 01/10/24 1311 18     Temp 01/10/24 1311 97.8 F (36.6 C)     Temp Source 01/10/24 1311 Oral     SpO2 01/10/24 1311 100 %     Weight 01/10/24 1310 123 lb (55.8 kg)     Height 01/10/24 1310 5\' 4"  (1.626 m)     Head Circumference --      Peak Flow --      Pain Score 01/10/24 1310 4     Pain Loc --      Pain Education --      Exclude from Growth Chart --    Most recent vital signs: Vitals:   01/10/24 1311 01/10/24 1415  BP: 139/74 (!) 142/91  Pulse: 79 70  Resp: 18   Temp: 97.8 F (36.6 C)   SpO2: 100% 100%   General: Awake, oriented x4. CV:  Good peripheral perfusion.  Resp:  Normal effort.  Abd:  No distention.  Other:  Elderly well-developed, well-nourished Caucasian female back in resting comfortably in no acute distress ED Results /  Procedures / Treatments  Labs (all labs ordered are listed, but only abnormal results are displayed) Labs Reviewed  BASIC METABOLIC PANEL - Abnormal; Notable for the following components:      Result Value   Glucose, Bld 120 (*)    All other components within normal limits  TROPONIN I (HIGH SENSITIVITY) - Abnormal; Notable for the following components:   Troponin I (High Sensitivity) 64 (*)    All other components within normal limits  CBC  TROPONIN I (HIGH SENSITIVITY)   EKG ED ECG REPORT I, Merwyn Katos, the attending physician, personally viewed and interpreted this ECG. Date: 01/10/2024 EKG Time: 1314 Rate: 72 Rhythm: normal sinus rhythm QRS Axis: normal Intervals: normal ST/T Wave abnormalities: Mild ST elevations in V4/V5 Narrative Interpretation: Normal sinus rhythm with mild ST elevations in V4/V5 RADIOLOGY ED MD interpretation: 2 view chest x-ray interpreted by me shows no evidence of acute abnormalities including no pneumonia, pneumothorax, or widened mediastinum -Agree with radiology assessment Official radiology report(s): DG Chest 2 View Result Date: 01/10/2024 CLINICAL DATA:  Mid chest pain. EXAM: CHEST - 2 VIEW COMPARISON:  12/11/2014. FINDINGS: Bilateral lung fields are clear. Bilateral costophrenic angles are clear. Normal cardio-mediastinal silhouette. No acute osseous abnormalities. The  soft tissues are within normal limits. IMPRESSION: No active cardiopulmonary disease. Electronically Signed   By: Jules Schick M.D.   On: 01/10/2024 14:25   PROCEDURES: Critical Care performed: Yes, see critical care procedure note(s) .1-3 Lead EKG Interpretation  Performed by: Merwyn Katos, MD Authorized by: Merwyn Katos, MD     Interpretation: abnormal     ECG rate:  71   ECG rate assessment: normal     Rhythm: sinus rhythm     Ectopy: none     Conduction: normal   CRITICAL CARE Performed by: Merwyn Katos  Total critical care time: 31 minutes  Critical  care time was exclusive of separately billable procedures and treating other patients.  Critical care was necessary to treat or prevent imminent or life-threatening deterioration.  Critical care was time spent personally by me on the following activities: development of treatment plan with patient and/or surrogate as well as nursing, discussions with consultants, evaluation of patient's response to treatment, examination of patient, obtaining history from patient or surrogate, ordering and performing treatments and interventions, ordering and review of laboratory studies, ordering and review of radiographic studies, pulse oximetry and re-evaluation of patient's condition.  MEDICATIONS ORDERED IN ED: Medications - No data to display IMPRESSION / MDM / ASSESSMENT AND PLAN / ED COURSE  I reviewed the triage vital signs and the nursing notes.                             The patient is on the cardiac monitor to evaluate for evidence of arrhythmia and/or significant heart rate changes. Patient's presentation is most consistent with acute presentation with potential threat to life or bodily function. Pt presents via EMS with active chest pain and an EKG concerning for ST Elevation MI.  Patient was otherwise in their normal state of health before this chest pain rapidly ensued.  DDx: TAA or dissection, pericarditis/tamponade, PTX.  Workup: CBC, BMP, troponin, T&S, PT/INR, PTT, Repeat EKGs until cath lab, CXR.  Therapy: Not hypoxic, not requiring O2. Completed full ASA load 324mg . Antiplatelet: 324 ASA Anticoagulation: Pt <75yo and with suspected normal kidney function, will start Heparin bolus 60U/kg followed by 12U/kg/h gtt.  Disposition: Admit directly to Cath Lab.   FINAL CLINICAL IMPRESSION(S) / ED DIAGNOSES   Final diagnoses:  None   Rx / DC Orders   ED Discharge Orders     None      Note:  This document was prepared using Dragon voice recognition software and may include  unintentional dictation errors.   Merwyn Katos, MD 01/10/24 [phone]

## 2024-01-10 NOTE — H&P (Signed)
 History and Physical    Patient: Leslie Shepard:[id number] DOB: 1961-04-07 DOA: 01/10/2024 DOS: the patient was seen and examined on 01/10/2024 PCP: Judy Pimple, MD  Patient coming from: home  Chief Complaint:  Chief Complaint  Patient presents with   Chest Pain   HPI: Leslie Shepard is a 63 y.o. female with medical history significant of hyperlipidemia intolerant to statins otherwise very active comes to the emergency room with ongoing chest pain for several weeks. Patient reports as chest pain mid substernal 5/10 radiating to the arms and having discomfort with constant pressure. Denies any nausea vomiting or diaphoresis.  Patient was seen by [facility] cardiology as outpatient in December for chest pain evaluation. At that time she was recommended to get CTA coronaries and echo of the heart. Patient apparently did not follow-up for the above test.  In the ER patient received aspirin. Her first troponin was 62 she was started on IV heparin drip per protocol. Repeat troponin was 310. Repeat EKG showed STT changes in inferior leads. ER physician discussed with Dr. Okey Dupre and code STEMI was initiated    Review of Systems: As mentioned in the history of present illness. All other systems reviewed and are negative. Past Medical History:  Diagnosis Date   Hyperlipidemia    PMDD (premenstrual dysphoric disorder)    Past Surgical History:  Procedure Laterality Date   none     Social History:  reports that she has never smoked. She has never used smokeless tobacco. She reports current alcohol use. She reports that she does not use drugs.  Allergies  Allergen Reactions   Statins     Malaise and muscle pain     Family History  Problem Relation Age of Onset   Hyperlipidemia Mother    Seizures Father        epilepsy   Other Sister        carotid stenosis, smoker   Hyperlipidemia Sister    Bulemia Sister    Breast cancer Maternal Aunt        M 1/2 Aunt ?   Depression Other         grandparents    Prior to Admission medications   Medication Sig Start Date End Date Taking? Authorizing Provider  metoprolol tartrate (LOPRESSOR) 100 MG tablet Take 1 tablet by mouth 2 hours before CT scan 10/17/23   Kathleene Hazel, MD  rosuvastatin (CRESTOR) 10 MG tablet Take 1 tablet (10 mg total) by mouth daily. In evening with a low fat snack 10/11/23   Judy Pimple, MD    Physical Exam: Vitals:   01/10/24 1310 01/10/24 1311 01/10/24 1415  BP:  139/74 (!) 142/91  Pulse:  79 70  Resp:  18   Temp:  97.8 F (36.6 C)   TempSrc:  Oral   SpO2:  100% 100%  Weight: 55.8 kg    Height: 5\' 4"  (1.626 m)    Physical Exam Constitutional:      Appearance: She is well-developed.  Eyes:     Extraocular Movements: Extraocular movements intact.     Pupils: Pupils are equal, round, and reactive to light.  Cardiovascular:     Rate and Rhythm: Normal rate and regular rhythm.     Heart sounds: Normal heart sounds.  Pulmonary:     Effort: Pulmonary effort is normal.     Breath sounds: Normal breath sounds.  Abdominal:     General: Bowel sounds are normal.  Palpations: Abdomen is soft.  Musculoskeletal:        General: Normal range of motion.  Skin:    General: Skin is warm and dry.  Neurological:     General: No focal deficit present.     Mental Status: She is alert and oriented to person, place, and time.     Leslie Shepard is a 63 y.o. female with medical history significant of hyperlipidemia intolerant to statins otherwise very active comes to the emergency room with ongoing chest pain for several weeks. Patient reports as chest pain mid substernal 5/10 radiating to the arms and having discomfort with constant pressure. Denies any nausea vomiting or diaphoresis.  Acute STEMI, inferior wall -- patient came in with on and off chest pain more so for last couple days. Constant now. -- Troponin 62-- 310 -- EKG with STT changes inferior leads -- IV heparin drip,  aspirin -- Dr.End consulted for [facility] cardiology and STEMI on call-- patient will go for emergent cardiac cath today. -- Await further cardiology recommendations -- continue IV fluids  Hyperlipidemia -- patient intolerant to statins -- check lipid profile     Advance Care Planning:   Code Status: Full Code discussed with patient  Consults: cardiology  Family Communication: husband at bedside critical time 60 minutes Severity of Illness: The appropriate patient status for this patient is INPATIENT. Inpatient status is judged to be reasonable and necessary in order to provide the required intensity of service to ensure the patient's safety. The patient's presenting symptoms, physical exam findings, and initial radiographic and laboratory data in the context of their chronic comorbidities is felt to place them at high risk for further clinical deterioration. Furthermore, it is not anticipated that the patient will be medically stable for discharge from the hospital within 2 midnights of admission.   * I certify that at the point of admission it is my clinical judgment that the patient will require inpatient hospital care spanning beyond 2 midnights from the point of admission due to high intensity of service, high risk for further deterioration and high frequency of surveillance required.*  Author: Enedina Finner, MD 01/10/2024 4:36 PM  For on call review [email or web address].

## 2024-01-10 NOTE — ED Notes (Signed)
@  4:15 called [facility] spoke with rep. Thomas gave rep. pt's vital signs , rep. activated a code STEMI.

## 2024-01-10 NOTE — ED Triage Notes (Signed)
 Patient states mid sternal chest pain that across chest and down both arms; states sitting makes it was worse but standing and walking makes it better.

## 2024-01-10 NOTE — Progress Notes (Signed)
   01/10/24 1620  Spiritual Encounters  Type of Visit Initial  Care provided to: Pt and family  Conversation partners present during encounter Nurse;Physician  Referral source Code page  Reason for visit Code  OnCall Visit Yes  Interventions  Spiritual Care Interventions Made Established relationship of care and support;Compassionate presence;Reflective listening;Normalization of emotions;Prayer;Encouragement  Intervention Outcomes  Outcomes Connection to spiritual care;Awareness around self/spiritual resourses;Reduced anxiety;Reduced fear  Spiritual Care Plan  Spiritual Care Issues Still Outstanding Referring to oncoming chaplain for further support

## 2024-01-10 NOTE — ED Notes (Signed)
All belongings given to family at bedside. 

## 2024-01-10 NOTE — Progress Notes (Signed)
 Upon initial assessment of patient, noted developing hematoma bulging out from underneath TR band. Only 1cc of air had been removed previously by day shift, and this was added back to the band immediately. Applied manual pressure and notified Dr. Okey Dupre. Cangrelor stopped early per provider request (medication was nearing completion already). Hematoma did not improve after 10 minutes manual hold, so manual BP cuff applied and maintained for 15 minutes and then deflated per order instruction. Hematoma site margins marked with skin marker. Site has improved in appearance and is no longer swelling or becoming larger in size. Will gradually deflate TR band per protocol and continue to assess the area.  Carmel Sacramento, RN

## 2024-01-10 NOTE — Progress Notes (Signed)
 Pt arrived to unit Aox4, no pain, TR Band with 8ml of air, clean, dry and intact, Vitals WNL. Will continue to monitor

## 2024-01-10 NOTE — Consult Note (Signed)
 Cardiology Consultation   Patient ID: Leslie Shepard MRN: [id number]; DOB: 16-Dec-1960  Admit date: 01/10/2024 Date of Consult: 01/10/2024  PCP:  Emily Filbert, MD   [facility] Providers Cardiologist:  Verne Carrow, MD        Patient Profile:   Leslie Shepard is a 63 y.o. female with a hx of with history of hyperlipidemia, who is being seen 01/10/2024 for the evaluation of chest pain and abnormal EKG at the request of Dr. Vicente Males.  History of Present Illness:   Leslie Shepard reports a several month history of intermittent exertional chest discomfort for which she saw Dr. Clifton James in [place] in December.  Coronary CTA was ordered but never pursued as the patient's pain seemed to improve and she was planning to travel to [place].  Over the last 10 to 12 days, she has had a recrudescence of chest pain with activity, sometimes lasting up to an hour after stopping to rest.  Today, she developed chest pain at rest that she describes as a pressure-like sensation with a maximum intensity of 6/10.  It seemed to worsen when she would bend forward or raise her arms.  The pain would also radiate to both arms.  She is currently feeling somewhat better with 2/10 chest pain after receiving aspirin and sublingual nitroglycerin.  She has not had any shortness of breath, palpitations, lightheadedness, or edema.  She is typically quite active.  She denies a history of known coronary disease and has never undergone any cardiac testing.   Past Medical History:  Diagnosis Date   Hyperlipidemia    PMDD (premenstrual dysphoric disorder)     Past Surgical History:  Procedure Laterality Date   none       Home Medications:  Prior to Admission medications   Medication Sig Start Date Leslie Shepard Date Taking? Authorizing Provider  metoprolol tartrate (LOPRESSOR) 100 MG tablet Take 1 tablet by mouth 2 hours before CT scan 10/17/23   Kathleene Hazel, MD  rosuvastatin (CRESTOR) 10 MG  tablet Take 1 tablet (10 mg total) by mouth daily. In evening with a low fat snack 10/11/23   Tower, Audrie Gallus, MD    Inpatient Medications: Scheduled Meds:  [START ON 01/11/2024] aspirin  81 mg Oral Daily   [START ON 01/11/2024] enoxaparin (LOVENOX) injection  40 mg Subcutaneous Q24H   sodium chloride flush  3 mL Intravenous Q12H   [START ON 01/11/2024] ticagrelor  90 mg Oral BID   Continuous Infusions:  sodium chloride     cangrelor (KENGREAL) 50 mg in sodium chloride 0.9 % 250 mL (0.2 mg/mL) infusion     PRN Meds: sodium chloride, acetaminophen **OR** acetaminophen, hydrALAZINE, ondansetron **OR** ondansetron (ZOFRAN) IV, sodium chloride flush, traZODone  Allergies:    Allergies  Allergen Reactions   Statins     Malaise and muscle pain     Social History:   Social History   Tobacco Use   Smoking status: Never   Smokeless tobacco: Never  Substance Use Topics   Alcohol use: Yes    Comment: 1-2 drinks per week   Drug use: No      Family History:   Family History  Problem Relation Age of Onset   Hyperlipidemia Mother    Seizures Father        epilepsy   Other Sister        carotid stenosis, smoker   Hyperlipidemia Sister    Bulemia Sister    Breast cancer Maternal Aunt  M 1/2 Aunt ?   Depression Other        grandparents     ROS:  Please see the history of present illness. All other ROS reviewed and negative.     Physical Exam/Data:   Vitals:   01/10/24 1311 01/10/24 1415 01/10/24 1657 01/10/24 1817  BP: 139/74 (!) 142/91  (!) 130/112  Pulse: 79 70  63  Resp: 18   18  Temp: 97.8 F (36.6 C)   97.8 F (36.6 C)  TempSrc: Oral   Oral  SpO2: 100% 100% 100% 100%  Weight:      Height:    5\' 4"  (1.626 m)   No intake or output data in the 24 hours ending 01/10/24 1830    01/10/2024    1:10 PM 10/17/2023    1:21 PM 10/11/2023   11:28 AM  Last 3 Weights  Weight (lbs) 123 lb 144 lb 146 lb 4 oz  Weight (kg) 55.792 kg 65.318 kg 66.339 kg     Body mass  index is 21.11 kg/m.  General:  Well nourished, well developed, in no acute distress. HEENT: normal Neck: no JVD Vascular: No carotid bruits; Distal pulses 2+ bilaterally Cardiac:  normal S1, S2; RRR; no murmurs Lungs:  clear to auscultation bilaterally, no wheezing, rhonchi or rales  Abd: soft, nontender, no hepatomegaly  Ext: no edema Musculoskeletal:  No deformities, BUE and BLE strength normal and equal Skin: warm and dry  Neuro:  CNs 2-12 intact, no focal abnormalities noted Psych:  Normal affect   EKG:  The EKG was personally reviewed and demonstrates:  normal sinus rhythm with ST elevation in V4 and V5 Telemetry:  Telemetry was personally reviewed and demonstrates:  Sinus rhythm with PVC's.  Relevant CV Studies: None  Laboratory Data:  High Sensitivity Troponin:   Recent Labs  Lab 01/10/24 1311 01/10/24 1524  TROPONINIHS 64* 310*     Chemistry Recent Labs  Lab 01/10/24 1311  NA 136  K 3.6  CL 102  CO2 23  GLUCOSE 120*  BUN 15  CREATININE 0.72  CALCIUM 10.1  GFRNONAA >60  ANIONGAP 11    No results for input(s): "PROT", "ALBUMIN", "AST", "ALT", "ALKPHOS", "BILITOT" in the last 168 hours. Lipids No results for input(s): "CHOL", "TRIG", "HDL", "LABVLDL", "LDLCALC", "CHOLHDL" in the last 168 hours.  Hematology Recent Labs  Lab 01/10/24 1311  WBC 6.2  RBC 4.36  HGB 13.4  HCT 39.9  MCV 91.5  MCH 30.7  MCHC 33.6  RDW 13.3  PLT 233   Thyroid No results for input(s): "TSH", "FREET4" in the last 168 hours.  BNPNo results for input(s): "BNP", "PROBNP" in the last 168 hours.  DDimer No results for input(s): "DDIMER" in the last 168 hours.   Radiology/Studies:  CARDIAC CATHETERIZATION Result Date: 01/10/2024 Conclusions: Severe single-vessel coronary artery disease with thrombotic occlusion of the proximal LAD.  There is also mild, nonobstructive disease involving the proximal LCx and mid RCA. Myocardial bridging of the distal LAD noted. Severely reduced  left ventricular systolic function (LVEF 25-35% with mid/apical hypokinesis/akinesis). Mildly elevated left ventricular filling pressure (LVEDP 18 mmHg, 25 mmHg post a-wave). Successful PCI to proximal LAD using Onyx Frontier 2.75 x 18 mm drug-eluting stent (postdilated to 3.3 mm) with 0% residual stenosis and TIMI-3 flow. Recommendations: Continue cangrelor infusion for 2 hours after ticagrelor load at the Larissa Pegg of PCI. Dual antiplatelet therapy with aspirin and ticagrelor for at least 12 months. Aggressive secondary prevention; patient has been intolerant  of simvastatin in the past and is reluctant to be rechallenged with statins.  If she does not wish to try a statin again, outpatient initiation of PCSK9 inhibitor, inclisiran, or bempedoic acid/ezetimibe will need to be considered. Follow-up echocardiogram. Maintain net even to slightly negative fluid balance. Initiate goal-directed medical therapy, as tolerated.  I will add low-dose metoprolol succinate to begin this evening. Yvonne Kendall, MD [facility]   DG Chest 2 View Result Date: 01/10/2024 CLINICAL DATA:  Mid chest pain. EXAM: CHEST - 2 VIEW COMPARISON:  12/11/2014. FINDINGS: Bilateral lung fields are clear. Bilateral costophrenic angles are clear. Normal cardio-mediastinal silhouette. No acute osseous abnormalities. The soft tissues are within normal limits. IMPRESSION: No active cardiopulmonary disease. Electronically Signed   By: Jules Schick M.D.   On: 01/10/2024 14:25     Assessment and Plan:   Anterior STEMI: Patient presents with progressive chest pain over the last few months that became quite pronounced over the last 10 to 12 days and severe today.  She continues to have chest pain with progressive ST elevation on her serial EKGs.  Her initial troponins are also mildly elevated.  I have recommended proceeding with emergent cardiac catheterization and possible PCI.  This demonstrated thrombotic occlusion of the proximal LAD, treated  with primary PCI.  LVEF also severely reduced. -Continue cangrelor infusion for 2 hours after ticagrelor load. -Dual antiplatelet therapy with aspirin and ticagrelor for at least 12 months. -Aggressive secondary prevention of coronary artery disease.  The patient reports myalgias with simvastatin in the past and is reluctant to be rechallenged with a statin.  I encouraged her to think about this tonight so that we can hopefully try an alternative statin tomorrow.  If she continues to decline statin therapy, will need to consider initiation of an alternative agent as an outpatient (PCSK9 inhibitor, inclisiran, or bempedoic acid/ezetimibe). -Initiate metoprolol succinate 12.5 mg daily. -Check echocardiogram. -Follow-up lipid panel and hemoglobin A1c.  Acute HFrEF: LVEF severely reduced by left ventriculogram with mildly elevated filling pressure.  Patient without preceding symptoms of heart failure.  Blood pressure normal. -Maintain net even to slightly negative fluid balance; will give furosemide 20 mg IV x 1. -Initiate metoprolol succinate 12.5 mg daily. -Consider adding ARB tomorrow if blood pressure and renal function allow. -If LVEF less than 35% on echocardiogram, consider LifeVest at discharge.  Hyperlipidemia: Most recent lipid panel in 09/2023 notable for severely elevated LDL of 234, suggestive of familial heterozygous hyperlipidemia.  Patient has been intolerant of statins in the past, having experienced myalgias with simvastatin many years ago.  I have recommended trial of an alternative statin, though the patient wishes to think about this and discuss it with her husband. -Consider initiation of rosuvastatin tomorrow if patient is agreeable. -If she declines initiation of a statin, consider adding ezetimibe prior to discharge and evaluation for PCSK9 inhibitor, inclisiran, or bempedoic acid/ezetimibe.   Risk Assessment/Risk Scores:     TIMI Risk Score for ST  Elevation MI:   The  patient's TIMI risk score is 3, which indicates a 4.4% risk of all cause mortality at 30 days.   For questions or updates, please contact [facility] Please consult [email or web address] for contact info under [facility].  Signed, Yvonne Kendall, MD  01/10/2024 6:30 PM

## 2024-01-11 ENCOUNTER — Other Ambulatory Visit (HOSPITAL_COMMUNITY): Payer: Self-pay

## 2024-01-11 ENCOUNTER — Other Ambulatory Visit: Payer: Self-pay

## 2024-01-11 ENCOUNTER — Encounter: Payer: Self-pay | Admitting: Internal Medicine

## 2024-01-11 ENCOUNTER — Telehealth (HOSPITAL_COMMUNITY): Payer: Self-pay | Admitting: Pharmacy Technician

## 2024-01-11 DIAGNOSIS — N179 Acute kidney failure, unspecified: Secondary | ICD-10-CM | POA: Insufficient documentation

## 2024-01-11 DIAGNOSIS — I249 Acute ischemic heart disease, unspecified: Secondary | ICD-10-CM

## 2024-01-11 DIAGNOSIS — R7989 Other specified abnormal findings of blood chemistry: Secondary | ICD-10-CM | POA: Insufficient documentation

## 2024-01-11 DIAGNOSIS — I469 Cardiac arrest, cause unspecified: Secondary | ICD-10-CM | POA: Insufficient documentation

## 2024-01-11 DIAGNOSIS — I4901 Ventricular fibrillation: Secondary | ICD-10-CM

## 2024-01-11 DIAGNOSIS — I2102 ST elevation (STEMI) myocardial infarction involving left anterior descending coronary artery: Secondary | ICD-10-CM | POA: Diagnosis not present

## 2024-01-11 DIAGNOSIS — I4721 Torsades de pointes: Secondary | ICD-10-CM | POA: Diagnosis not present

## 2024-01-11 DIAGNOSIS — I213 ST elevation (STEMI) myocardial infarction of unspecified site: Secondary | ICD-10-CM

## 2024-01-11 DIAGNOSIS — I255 Ischemic cardiomyopathy: Secondary | ICD-10-CM | POA: Diagnosis not present

## 2024-01-11 DIAGNOSIS — E876 Hypokalemia: Secondary | ICD-10-CM

## 2024-01-11 DIAGNOSIS — I259 Chronic ischemic heart disease, unspecified: Secondary | ICD-10-CM

## 2024-01-11 DIAGNOSIS — E782 Mixed hyperlipidemia: Secondary | ICD-10-CM

## 2024-01-11 DIAGNOSIS — Z955 Presence of coronary angioplasty implant and graft: Secondary | ICD-10-CM | POA: Diagnosis not present

## 2024-01-11 LAB — RENAL FUNCTION PANEL
Albumin: 4 g/dL (ref 3.5–5.0)
Albumin: 3.7 g/dL (ref 3.5–5.0)
Anion gap: 12 (ref 5–15)
Anion gap: 9 (ref 5–15)
BUN: 16 mg/dL (ref 8–23)
BUN: 11 mg/dL (ref 8–23)
CO2: 20 mmol/L — ABNORMAL LOW (ref 22–32)
CO2: 22 mmol/L (ref 22–32)
Calcium: 9.2 mg/dL (ref 8.9–10.3)
Calcium: 9.2 mg/dL (ref 8.9–10.3)
Chloride: 101 mmol/L (ref 98–111)
Chloride: 98 mmol/L (ref 98–111)
Creatinine, Ser: 1.09 mg/dL — ABNORMAL HIGH (ref 0.44–1.00)
Creatinine, Ser: 0.69 mg/dL (ref 0.44–1.00)
GFR, Estimated: 57 mL/min — ABNORMAL LOW (ref >60)
GFR, Estimated: >60 mL/min (ref >60)
Glucose, Bld: 177 mg/dL — ABNORMAL HIGH (ref 70–99)
Glucose, Bld: 108 mg/dL — ABNORMAL HIGH (ref 70–99)
Phosphorus: 4 mg/dL (ref 2.5–4.6)
Phosphorus: 3 mg/dL (ref 2.5–4.6)
Potassium: 3.3 mmol/L — ABNORMAL LOW (ref 3.5–5.1)
Potassium: 4.1 mmol/L (ref 3.5–5.1)
Sodium: 133 mmol/L — ABNORMAL LOW (ref 135–145)
Sodium: 129 mmol/L — ABNORMAL LOW (ref 135–145)

## 2024-01-11 LAB — BASIC METABOLIC PANEL
Anion gap: 8 (ref 5–15)
BUN: 14 mg/dL (ref 8–23)
CO2: 21 mmol/L — ABNORMAL LOW (ref 22–32)
Calcium: 8.7 mg/dL — ABNORMAL LOW (ref 8.9–10.3)
Chloride: 102 mmol/L (ref 98–111)
Creatinine, Ser: 0.68 mg/dL (ref 0.44–1.00)
GFR, Estimated: >60 mL/min (ref >60)
Glucose, Bld: 161 mg/dL — ABNORMAL HIGH (ref 70–99)
Potassium: 4.3 mmol/L (ref 3.5–5.1)
Sodium: 131 mmol/L — ABNORMAL LOW (ref 135–145)

## 2024-01-11 LAB — CBC
HCT: 34.7 % — ABNORMAL LOW (ref 36.0–46.0)
HCT: 37.5 % (ref 36.0–46.0)
Hemoglobin: 12.1 g/dL (ref 12.0–15.0)
Hemoglobin: 13 g/dL (ref 12.0–15.0)
MCH: 30.6 pg (ref 26.0–34.0)
MCH: 30.7 pg (ref 26.0–34.0)
MCHC: 34.7 g/dL (ref 30.0–36.0)
MCHC: 34.9 g/dL (ref 30.0–36.0)
MCV: 87.6 fL (ref 80.0–100.0)
MCV: 88.7 fL (ref 80.0–100.0)
Platelets: 192 10*3/uL (ref 150–400)
Platelets: 215 10*3/uL (ref 150–400)
RBC: 3.96 MIL/uL (ref 3.87–5.11)
RBC: 4.23 MIL/uL (ref 3.87–5.11)
RDW: 13.2 % (ref 11.5–15.5)
RDW: 13.2 % (ref 11.5–15.5)
WBC: 8.8 10*3/uL (ref 4.0–10.5)
WBC: 9.3 10*3/uL (ref 4.0–10.5)
nRBC: 0 % (ref 0.0–0.2)
nRBC: 0 % (ref 0.0–0.2)

## 2024-01-11 LAB — ECHOCARDIOGRAM COMPLETE
AR max vel: 2.07 cm2
AV Area VTI: 2.25 cm2
AV Area mean vel: 1.99 cm2
AV Mean grad: 2.8 mmHg
AV Peak grad: 5.3 mmHg
Ao pk vel: 1.16 m/s
Area-P 1/2: 3.56 cm2
Calc EF: 48.5 %
Height: 64 in
S' Lateral: 3.2 cm
Single Plane A2C EF: 48.3 %
Single Plane A4C EF: 46.8 %
Weight: 1968 [oz_av]

## 2024-01-11 LAB — COMPREHENSIVE METABOLIC PANEL
ALT: 173 U/L — ABNORMAL HIGH (ref 0–44)
AST: 504 U/L — ABNORMAL HIGH (ref 15–41)
Albumin: 3.9 g/dL (ref 3.5–5.0)
Alkaline Phosphatase: 50 U/L (ref 38–126)
Anion gap: 13 (ref 5–15)
BUN: 15 mg/dL (ref 8–23)
CO2: 18 mmol/L — ABNORMAL LOW (ref 22–32)
Calcium: 9.3 mg/dL (ref 8.9–10.3)
Chloride: 101 mmol/L (ref 98–111)
Creatinine, Ser: 1.03 mg/dL — ABNORMAL HIGH (ref 0.44–1.00)
GFR, Estimated: >60 mL/min (ref >60)
Glucose, Bld: 182 mg/dL — ABNORMAL HIGH (ref 70–99)
Potassium: 3.2 mmol/L — ABNORMAL LOW (ref 3.5–5.1)
Sodium: 132 mmol/L — ABNORMAL LOW (ref 135–145)
Total Bilirubin: 1.1 mg/dL (ref 0.0–1.2)
Total Protein: 6.2 g/dL — ABNORMAL LOW (ref 6.5–8.1)

## 2024-01-11 LAB — LIPID PANEL
Cholesterol: 229 mg/dL — ABNORMAL HIGH (ref 0–200)
HDL: 60 mg/dL (ref >40)
LDL Cholesterol: 155 mg/dL — ABNORMAL HIGH (ref 0–99)
Total CHOL/HDL Ratio: 3.8 ratio
Triglycerides: 68 mg/dL (ref <150)
VLDL: 14 mg/dL (ref 0–40)

## 2024-01-11 LAB — TROPONIN I (HIGH SENSITIVITY): Troponin I (High Sensitivity): >24000 ng/L (ref <18)

## 2024-01-11 LAB — LACTIC ACID, PLASMA
Lactic Acid, Venous: 1.1 mmol/L (ref 0.5–1.9)
Lactic Acid, Venous: 3.7 mmol/L (ref 0.5–1.9)

## 2024-01-11 LAB — MAGNESIUM: Magnesium: 2.2 mg/dL (ref 1.7–2.4)

## 2024-01-11 LAB — HIV ANTIBODY (ROUTINE TESTING W REFLEX): HIV Screen 4th Generation wRfx: NONREACTIVE

## 2024-01-11 MED ORDER — OXYCODONE HCL 5 MG PO TABS: 5.0000 mg | ORAL_TABLET | Freq: Four times a day (QID) | ORAL | Status: DC | PRN

## 2024-01-11 MED ORDER — SPIRONOLACTONE 12.5 MG HALF TABLET
12.5000 mg | ORAL_TABLET | Freq: Every day | ORAL | Status: DC
  Administered 2024-01-11: 12.5 mg via ORAL
  Filled 2024-01-11 (×2): qty 1

## 2024-01-11 MED ORDER — LIDOCAINE IN D5W 4-5 MG/ML-% IV SOLN
1.0000 mg/min | INTRAVENOUS | Status: AC
  Administered 2024-01-11 – 2024-01-12 (×2): 1 mg/min via INTRAVENOUS
  Filled 2024-01-11 (×2): qty 500

## 2024-01-11 MED ORDER — AMIODARONE HCL IN DEXTROSE 360-4.14 MG/200ML-% IV SOLN
30.0000 mg/h | INTRAVENOUS | Status: AC
Start: 2024-01-11 — End: 2024-01-13
  Administered 2024-01-11 – 2024-01-13 (×4): 30 mg/h via INTRAVENOUS
  Filled 2024-01-11 (×4): qty 200

## 2024-01-11 MED ORDER — MAGNESIUM SULFATE 2 GM/50ML IV SOLN
2.0000 g | Freq: Once | INTRAVENOUS | Status: AC
  Administered 2024-01-11: 2 g via INTRAVENOUS
  Filled 2024-01-11: qty 50

## 2024-01-11 MED ORDER — LIDOCAINE BOLUS VIA INFUSION
100.0000 mg | Freq: Once | INTRAVENOUS | Status: AC
  Administered 2024-01-11: 100 mg via INTRAVENOUS
  Filled 2024-01-11: qty 100

## 2024-01-11 MED ORDER — PRASUGREL HCL 10 MG PO TABS
5.0000 mg | ORAL_TABLET | Freq: Every day | ORAL | Status: DC
  Administered 2024-01-13 – 2024-01-14 (×2): 5 mg via ORAL
  Filled 2024-01-11 (×2): qty 1

## 2024-01-11 MED ORDER — AMIODARONE HCL IN DEXTROSE 360-4.14 MG/200ML-% IV SOLN
60.0000 mg/h | INTRAVENOUS | Status: AC
  Administered 2024-01-11 (×2): 60 mg/h via INTRAVENOUS
  Filled 2024-01-11 (×2): qty 200

## 2024-01-11 MED ORDER — POTASSIUM CHLORIDE 10 MEQ/100ML IV SOLN
10.0000 meq | INTRAVENOUS | Status: AC
  Administered 2024-01-11 (×2): 10 meq via INTRAVENOUS
  Filled 2024-01-11 (×2): qty 100

## 2024-01-11 MED ORDER — METOPROLOL SUCCINATE ER 25 MG PO TB24
12.5000 mg | ORAL_TABLET | Freq: Every day | ORAL | Status: DC
  Administered 2024-01-11 – 2024-01-14 (×4): 12.5 mg via ORAL
  Filled 2024-01-11 (×4): qty 0.5

## 2024-01-11 MED ORDER — MORPHINE SULFATE (PF) 2 MG/ML IV SOLN: 1.0000 mg | INTRAVENOUS | Status: DC | PRN

## 2024-01-11 MED ORDER — PRASUGREL HCL 10 MG PO TABS
60.0000 mg | ORAL_TABLET | Freq: Once | ORAL | Status: AC
  Administered 2024-01-12: 60 mg via ORAL
  Filled 2024-01-11: qty 6

## 2024-01-11 MED ORDER — CHLORHEXIDINE GLUCONATE CLOTH 2 % EX PADS
6.0000 | MEDICATED_PAD | Freq: Every day | CUTANEOUS | Status: DC
  Administered 2024-01-11 – 2024-01-12 (×3): 6 via TOPICAL

## 2024-01-11 MED ORDER — POTASSIUM CHLORIDE 20 MEQ PO PACK
40.0000 meq | PACK | Freq: Once | ORAL | Status: AC
  Administered 2024-01-11: 40 meq via ORAL
  Filled 2024-01-11: qty 2

## 2024-01-11 MED ORDER — POTASSIUM CHLORIDE CRYS ER 20 MEQ PO TBCR
40.0000 meq | EXTENDED_RELEASE_TABLET | Freq: Once | ORAL | Status: AC
  Administered 2024-01-11: 40 meq via ORAL
  Filled 2024-01-11: qty 2

## 2024-01-11 NOTE — Assessment & Plan Note (Signed)
 Secondary to ventricular fibrillation.  Patient on amiodarone drip.  Lidocaine drip discontinued today and started on mexiletine.

## 2024-01-11 NOTE — Progress Notes (Signed)
 Rounding Note    Patient Name: Leslie Shepard Date of Encounter: 01/11/2024  [facility] Cardiologist: Verne Carrow, MD   Subjective   Patient developed several runs of VF and VT/Torsades overnight requiring ~2 min CPR, 2 shocks, IV lidocaine, and IV amiodarone with conversion to sinus rhythm. No further episodes this morning. Patient reports ongoing chest pain since cardioversion.   Inpatient Medications    Scheduled Meds:  aspirin  81 mg Oral Daily   Chlorhexidine Gluconate Cloth  6 each Topical Daily   enoxaparin (LOVENOX) injection  40 mg Subcutaneous Q24H   potassium chloride  40 mEq Oral Once   sodium chloride flush  3 mL Intravenous Q12H   spironolactone  12.5 mg Oral Daily   ticagrelor  90 mg Oral BID   Continuous Infusions:  sodium chloride     amiodarone 30 mg/hr (01/11/24 0948)   lidocaine 1 mg/min (01/11/24 0948)   PRN Meds: sodium chloride, acetaminophen **OR** acetaminophen, morphine injection, ondansetron **OR** ondansetron (ZOFRAN) IV, mouth rinse, oxyCODONE, sodium chloride flush, traZODone   Vital Signs    Vitals:   01/11/24 0615 01/11/24 0700 01/11/24 0800 01/11/24 0905  BP:  113/76 118/80 119/73  Pulse: 67 71 67 69  Resp: (!) 9 19 16 13   Temp:   98.4 F (36.9 C)   TempSrc:   Oral   SpO2: 97% 99% 96% 99%  Weight:      Height:        Intake/Output Summary (Last 24 hours) at 01/11/2024 1104 Last data filed at 01/11/2024 0948 Gross per 24 hour  Intake 1088.22 ml  Output 1350 ml  Net -261.78 ml      01/10/2024    1:10 PM 10/17/2023    1:21 PM 10/11/2023   11:28 AM  Last 3 Weights  Weight (lbs) 123 lb 144 lb 146 lb 4 oz  Weight (kg) 55.792 kg 65.318 kg 66.339 kg      Telemetry    Ventricular fibrillation at 0111 and 1230, ventricular tachycardia/torsades at 2300 and 2725, now in sinus rhythm - Personally Reviewed  Physical Exam   GEN: No acute distress.   Neck: No JVD Cardiac: RRR, no murmurs, rubs, or gallops.   Respiratory: Clear to auscultation bilaterally. GI: Soft, nontender, non-distended  MS: No edema; No deformity. Neuro:  Nonfocal  Psych: Normal affect   Labs    High Sensitivity Troponin:   Recent Labs  Lab 01/10/24 1311 01/10/24 1524 01/10/24 1840  TROPONINIHS 64* 310* >24,000*     Chemistry Recent Labs  Lab 01/10/24 1840 01/11/24 0001 01/11/24 0351 01/11/24 0942  NA  --  132* 131* 133*  K  --  3.2* 4.3 3.3*  CL  --  101 102 101  CO2  --  18* 21* 20*  GLUCOSE  --  182* 161* 177*  BUN  --  15 14 16   CREATININE  --  1.03* 0.68 1.09*  CALCIUM  --  9.3 8.7* 9.2  MG  --  2.2  --   --   PROT 6.3* 6.2*  --   --   ALBUMIN 4.1 3.9  --  4.0  AST 232* 504*  --   --   ALT 51* 173*  --   --   ALKPHOS 46 50  --   --   BILITOT 1.1 1.1  --   --   GFRNONAA  --  >60 >60 57*  ANIONGAP  --  13 8 12  Lipids  Recent Labs  Lab 01/11/24 0351  CHOL 229*  TRIG 68  HDL 60  LDLCALC 155*  CHOLHDL 3.8    Hematology Recent Labs  Lab 01/10/24 1311 01/11/24 0001 01/11/24 0351  WBC 6.2 9.3 8.8  RBC 4.36 4.23 3.96  HGB 13.4 13.0 12.1  HCT 39.9 37.5 34.7*  MCV 91.5 88.7 87.6  MCH 30.7 30.7 30.6  MCHC 33.6 34.7 34.9  RDW 13.3 13.2 13.2  PLT 233 215 192   Thyroid No results for input(s): "TSH", "FREET4" in the last 168 hours.  BNPNo results for input(s): "BNP", "PROBNP" in the last 168 hours.  DDimer No results for input(s): "DDIMER" in the last 168 hours.   Radiology    ECHOCARDIOGRAM COMPLETE Result Date: 01/11/2024 IMPRESSIONS  1. Left ventricular ejection fraction, by estimation, is 35 to 40%. The left ventricle has moderately decreased function. The left ventricle demonstrates regional wall motion abnormalities (see scoring diagram/findings for description). There is mild asymmetric left ventricular hypertrophy of the basal-septal segment. Left ventricular diastolic parameters are consistent with Grade II diastolic dysfunction (pseudonormalization).  2. Right ventricular  systolic function is low normal. The right ventricular size is normal. There is normal pulmonary artery systolic pressure.  3. The mitral valve is normal in structure. Mild mitral valve regurgitation. No evidence of mitral stenosis.  4. The aortic valve is tricuspid. Aortic valve regurgitation is not visualized. No aortic stenosis is present.  5. The inferior vena cava is normal in size with greater than 50% respiratory variability, suggesting right atrial pressure of 3 mmHg. Electronically signed by Yvonne Kendall MD Signature Date/Time: 01/11/2024/5:21:59 AM    Final    CARDIAC CATHETERIZATION Result Date: 01/10/2024 Conclusions: Severe single-vessel coronary artery disease with thrombotic occlusion of the proximal LAD.  There is also mild, nonobstructive disease involving the proximal LCx and mid RCA. Myocardial bridging of the distal LAD noted. Severely reduced left ventricular systolic function (LVEF 25-35% with mid/apical hypokinesis/akinesis). Mildly elevated left ventricular filling pressure (LVEDP 18 mmHg, 25 mmHg post a-wave). Successful PCI to proximal LAD using Onyx Frontier 2.75 x 18 mm drug-eluting stent (postdilated to 3.3 mm) with 0% residual stenosis and TIMI-3 flow. Recommendations: Continue cangrelor infusion for 2 hours after ticagrelor load at the end of PCI. Dual antiplatelet therapy with aspirin and ticagrelor for at least 12 months. Aggressive secondary prevention; patient has been intolerant of simvastatin in the past and is reluctant to be rechallenged with statins.  If she does not wish to try a statin again, outpatient initiation of PCSK9 inhibitor, inclisiran, or bempedoic acid/ezetimibe will need to be considered. Follow-up echocardiogram. Maintain net even to slightly negative fluid balance. Initiate goal-directed medical therapy, as tolerated.  I will add low-dose metoprolol succinate to begin this evening. Yvonne Kendall, MD [facility]   DG Chest 2 View Result Date:  01/10/2024 CLINICAL DATA:  Mid chest pain. EXAM: CHEST - 2 VIEW COMPARISON:  12/11/2014. FINDINGS: Bilateral lung fields are clear. Bilateral costophrenic angles are clear. Normal cardio-mediastinal silhouette. No acute osseous abnormalities. The soft tissues are within normal limits. IMPRESSION: No active cardiopulmonary disease. Electronically Signed   By: Jules Schick M.D.   On: 01/10/2024 14:25    Cardiac Studies   01/10/2024 LHC Severe single-vessel coronary artery disease with thrombotic occlusion of the proximal LAD.  There is also mild, nonobstructive disease involving the proximal LCx and mid RCA. Myocardial bridging of the distal LAD noted. Severely reduced left ventricular systolic function (LVEF 25-35% with mid/apical hypokinesis/akinesis). Mildly  elevated left ventricular filling pressure (LVEDP 18 mmHg, 25 mmHg post a-wave). Successful PCI to proximal LAD using Onyx Frontier 2.75 x 18 mm drug-eluting stent (postdilated to 3.3 mm) with 0% residual stenosis and TIMI-3 flow.  01/10/2024 Echo complete 1. Left ventricular ejection fraction, by estimation, is 35 to 40%. The  left ventricle has moderately decreased function. The left ventricle  demonstrates regional wall motion abnormalities (see scoring  diagram/findings for description). There is mild  asymmetric left ventricular hypertrophy of the basal-septal segment. Left  ventricular diastolic parameters are consistent with Grade II diastolic  dysfunction (pseudonormalization).   2. Right ventricular systolic function is low normal. The right  ventricular size is normal. There is normal pulmonary artery systolic  pressure.   3. The mitral valve is normal in structure. Mild mitral valve  regurgitation. No evidence of mitral stenosis.   4. The aortic valve is tricuspid. Aortic valve regurgitation is not  visualized. No aortic stenosis is present.   5. The inferior vena cava is normal in size with greater than 50%  respiratory  variability, suggesting right atrial pressure of 3 mmHg.   Patient Profile     Leslie Shepard is a 63 y.o. female with a hx of with history of hyperlipidemia, who is being seen for the evaluation of STEMI.   Assessment & Plan    Anterior STEMI Ventricular tachycardia/fibrillation - Presented 2/25 with progressive chest pain over several months, progressive ST elevation on EKG with elevated troponin, taken for emergent cardiac catheterization - LHC showed thrombotic occlusion of the proximal LAD, treated with primary PCI and DES placement - Patient did have several episodes of arrhythmia overnight including: ventricular fibrillation at 0111 and 1230, ventricular tachycardia/torsades at 2300 and 2725. Patient received CPR for ~ 2 minutes with defibrillation x2, given lidocaine bolus, started on lidocaine infusion, and amiodarone infusion - Ongoing chest pain likely secondary to CPR and defibrillation  - Repeat EKG done and ST changes slightly improved from prior - EP consulted today and recommended continuation of IV amiodarone for 72 hours and IV lidocaine for 24 hours with subsequent transition to oral mexiletine - Will likely need Life Vest at discharge, will defer to EP - Recommend outpatient cardiac MRI  - Continue DAPT with ASA and ticagrelor for at least 12 months. If patient is unable to afford ticagrelor, discussed with Dr. Okey Dupre and he approved transition to prasugrel with 60 mg load followed by 5 mg daily.   Acute HFrEF - Echo with LVEF 35-40%, mild asymmetric LVH, GIIDD, low normal RV function, mild MR - Will hold off on initiation of diuretic and BB in the setting of hypotension - Advanced heart failure team to see today, appreciate recommendations  Hyperlipidemia - LDL 155 - Patient has history of hyperlipidemia with previous lipid panel 09/2023 LDL 234, suggestive of familial heterozygous hyperlipidemia - Intolerant to statins in the past with myalgias on simvastatin several  years ago - Consider evaluation for PCSK9 inhibitor with initiation of lipid lowering therapy prior to discharge  For questions or updates, please contact [facility] Please consult [email or web address] for contact info under     Signed, Orion Crook, PA-C  01/11/2024, 11:04 AM

## 2024-01-11 NOTE — Assessment & Plan Note (Signed)
 LDL 155.  Patient intolerant to statins.

## 2024-01-11 NOTE — Telephone Encounter (Signed)
 Patient Product/process development scientist completed.    The patient is insured through [employer]. Patient has [employer], may use a copay card, and/or apply for patient assistance if available.    Ran test claim for Repatha Sureclick and Requires Prior Authorization  Ran test claim for Praluent 150 mg and Not on Formulary  Ran test claim for Brilinta 90 mg and Not on Formulary  Ran test claim for Nexlizet 180-10  mg and Not on Formulary  This test claim was processed through [employer]- copay amounts may vary at other pharmacies due to [employer], or as the patient moves through the different stages of their insurance plan.     Roland Earl, CPHT Pharmacy Technician III Certified Patient Advocate [facility] Patient Advocate Team Direct Number: [phone]  Fax: [phone]

## 2024-01-11 NOTE — Procedures (Signed)
 Cardiopulmonary Resuscitation Note  Leslie Shepard  [id number]  03-Sep-1961  Date:01/11/24  Time:12:07 AM   Provider Performing:Jasmon Graffam L Rust-Chester   Procedure: Cardiopulmonary Resuscitation (92950)  Indication(s) Loss of Pulse  Consent N/A  Anesthesia N/A   Time Out N/A   Sterile Technique Hand hygiene, gloves   Procedure Description Called to patient's room for CODE BLUE. Initial rhythm was Vfib/Vtach. Patient received high quality chest compressions for 1 minutes with one defibrillation at 200 J. Return of spontaneous circulation was achieved. Patient alert and oriented, but shaken.  Cardiology to be called by [facility] APP.   Complications/Tolerance N/A   EBL N/A   Specimen(s) N/A  Estimated time to ROSC: 2 minutes

## 2024-01-11 NOTE — Assessment & Plan Note (Signed)
 Creatinine is low at 0.68 this morning and up to 1.09

## 2024-01-11 NOTE — Progress Notes (Signed)
 Briefly, patient is a 63 year old female s/p LAD STEMI with severely reduced EF. Called for episode of VF arrest requiring defibrillation and return to sinus rhythm. Patient started on amiodarone at that time.  Called for two subsequent episodes, 1 spontaneously resolved, 1 required defib. Etiology appears to be R on T phenomenon. Post EKG reviewed, similar appearance to prior with improved St segments. Qtc only 449. K low at 3.2, supplementing with IV and PO and given IV magnesium. Lidocaine started at 1.  If recurrent would need to activate cath lab for likely temp pacer placement. Does not appear to be stent thrombosis based on reported lack of chest pain and EKG. Discussed with on call NP.  Leslie Shepard

## 2024-01-11 NOTE — Progress Notes (Signed)
 Chaplain responds to code blue. Pt is quickly revived and welcoming of chaplain support and presence. She begins to process what has happened and shares her struggle about her estranged older son. She again goes into vfib and code blue is called again. Chaplain provides presence as pt quickly regains consciousness on her own and code is canceled. RN Thurston Hole is in and out of room providing care and medication. Pt asks chaplain to stay until 0100, which chaplain agrees to do. Pt eventually goes to sleep, and chaplain checks in with RN before leaving unit.

## 2024-01-11 NOTE — Progress Notes (Signed)
 Patient had third episode of irregular rhythm (appearing to be torsades), requiring defibrillation. After defib, patient returned to a normal sinus rhythm. Patient given 40 mEq potassium packet, and IV potassium ordered by [place], NP.  Carmel Sacramento, RN

## 2024-01-11 NOTE — Assessment & Plan Note (Signed)
 Replaced

## 2024-01-11 NOTE — Progress Notes (Signed)
 Heart Failure Navigator Progress Note  Assessed for Heart & Vascular TOC clinic readiness.  Patient does not meet criteria due to current Advanced Heart Failure team patient of Dr. Romie Minus, MD.   Navigator will sign off at this time.  Roxy Horseman, RN, BSN [facility] Heart Failure Navigator Secure Chat Only

## 2024-01-11 NOTE — Telephone Encounter (Signed)
 Pharmacy Patient Advocate Encounter   Received notification that prior authorization for Repatha SureClick 140MG /ML auto-injectors is required/requested.   Insurance verification completed.   The patient is insured through [facility] .   Per test claim: PA required; PA submitted to above mentioned insurance via CoverMyMeds Key/confirmation #/EOC [id number] Status is pending

## 2024-01-11 NOTE — Progress Notes (Signed)
 Amiodarone drip ordered. Shortly after drip was initiated, patient went into irregular rhythm appearing to be torsades de pointes with sudden change in mental status. As the interdisciplinary team was preparing to shock the patient, patient came out of rhythm on her own and regained alertness. 2g magnesium sulfate ordered and administered via IV. Patient visibly shaken with these events, and chaplain is at bedside for support.  Carmel Sacramento, RN

## 2024-01-11 NOTE — Hospital Course (Addendum)
 63 year old female with past medical history of hyperlipidemia intolerant of statins and was very active coming into the emergency room with ongoing chest pain.  Patient was found to have a STEMI and brought to the cardiac Cath Lab and had a stent in the LAD.  EF on cath 25 to 30%  Overnight CODE BLUE was called secondary to ventricular fibrillation.  Patient was shocked and then became responsive.  Patient had 2 more episodes of potentially torsades on the monitor and shocked another time.  Received magnesium.  Patient was started on lidocaine drip and amiodarone.  Echocardiogram shows an EF of 35 to 40%.  2/26.  Patient feeling better on amiodarone and amiodarone drip.  Seen by EP specialist and cardiology.  Replace potassium. 2/27.  Lidocaine drip discontinued today.  Started on mexiletine.  Still on amiodarone drip.  Will need a LifeVest.  Replacing IV magnesium today. 2/28.  Amiodarone drip discontinued today.  LifeVest fitting for today  3/1: Patient remained hemodynamically stable.  Started on p.o. amiodarone and mexiletine.  No cardiac chest pain while walking around but patient does have musculoskeletal chest pain after getting CPR.  No shortness of breath.  She was provided with LifeVest.  She will continue low-dose rosuvastatin with the plan to transition to PCSK9 inhibitor as outpatient due to being intolerant to high intensity statin.  Cardiology cleared her for discharge and they will arrange close follow-up.  Patient will continue on current medications and need to have a close follow-up with cardiology for further recommendations.

## 2024-01-11 NOTE — Assessment & Plan Note (Signed)
 Status post stent in the LAD.  Patient on Effient, aspirin, Zetia, Toprol-XL and low-dose Crestor.  LDL 155.

## 2024-01-11 NOTE — Consult Note (Signed)
 ELECTROPHYSIOLOGY CONSULT NOTE    Patient ID: Leslie Shepard MRN: [id number], DOB/AGE: [birth year] 63 y.o.  Admit date: 01/10/2024 Date of Consult: 01/11/2024  Primary Physician: Emily Filbert, MD Primary Cardiologist: Verne Carrow, MD  Electrophysiologist: none    Referring Provider: Dr. Okey Dupre  Patient Profile: Leslie Shepard is a 63 y.o. female with a history of hyperlipidemia, NSTEMI with PCI to LAD 2/25 who is being seen today for the evaluation of VT/VF after at the request of Dr. Renae Gloss.  HPI:  Leslie Shepard is a 63 y.o. female with PMH as above.  She presented to [facility] ER 2/25 with chest pain with activity x 10-12 days. EKGs with ST elevation in V4, V5 with rising trops. LHC revealed LAD thrombotic occlusion, s/p successful PCI to the LAD. TTE post-procedure showed reduced LVEF to 35-40%  She developed VF/VT around midnight, received CPR and converted to SR with 200J x 1. Amiodarone bolus + gtt was ordered. According to nursing notes, shortly after starting amiodarone, patient developed TDP with LOC. Patient converted without further intervention. IV mag ordered. Several minutes later, patient again developed TDP requiring defib x 1. IV and PO K ordered at that time and she was tx to the ICU. HF MD recommended starting lidocaine gtt.   On interview this morning, she has some chest discomfort but no frank chest pain, chest pressure. She thinks her mental fogginess is improving. No dizziness, presyncope, though has been in bed since her arrhythmia episodes overnight.  She is appropriately nervous about getting out of bed, but requests to use bedside commode  Husband at bedside   Labs Potassium4.3 (02/26 0351) Magnesium  2.2 (02/26 0001) Creatinine, ser  0.68 (02/26 0351) PLT  192 (02/26 0351) HGB  12.1 (02/26 0351) WBC 8.8 (02/26 0351) Troponin I (High Sensitivity)>24,000* (02/25 1840).    Past Medical History:  Diagnosis Date   Hyperlipidemia    PMDD  (premenstrual dysphoric disorder)      Surgical History:  Past Surgical History:  Procedure Laterality Date   none       Medications Prior to Admission  Medication Sig Dispense Refill Last Dose/Taking   metoprolol tartrate (LOPRESSOR) 100 MG tablet Take 1 tablet by mouth 2 hours before CT scan 1 tablet 0    rosuvastatin (CRESTOR) 10 MG tablet Take 1 tablet (10 mg total) by mouth daily. In evening with a low fat snack 30 tablet 1     Inpatient Medications:   aspirin  81 mg Oral Daily   Chlorhexidine Gluconate Cloth  6 each Topical Daily   enoxaparin (LOVENOX) injection  40 mg Subcutaneous Q24H   sodium chloride flush  3 mL Intravenous Q12H   ticagrelor  90 mg Oral BID    Allergies:  Allergies  Allergen Reactions   Statins     Malaise and muscle pain     Family History  Problem Relation Age of Onset   Hyperlipidemia Mother    Seizures Father        epilepsy   Other Sister        carotid stenosis, smoker   Hyperlipidemia Sister    Bulemia Sister    Breast cancer Maternal Aunt        M 1/2 Aunt ?   Depression Other        grandparents     Physical Exam: Vitals:   01/11/24 0545 01/11/24 0600 01/11/24 0615 01/11/24 0700  BP:  107/64  113/76  Pulse: 60 62 67  71  Resp: 14 17 (!) 9 19  Temp:      TempSrc:      SpO2: 98% 97% 97% 99%  Weight:      Height:        GEN- NAD, A&O x 3, normal affect HEENT: Normocephalic, atraumatic Lungs- CTAB, Normal effort.  Heart- Regular rate and rhythm, No M/G/R.  GI- Soft, NT, ND.  Extremities- No clubbing, cyanosis, or edema. R wrist with bruising into palm. R hand is warm with 2+ radial pulse   Radiology/Studies: ECHOCARDIOGRAM COMPLETE Result Date: 01/11/2024    ECHOCARDIOGRAM REPORT   Patient Name:   Leslie Shepard Date of Exam: 01/10/2024 Medical Rec #:  [id number]       Height:       64.0 in Accession #:    [id number]      Weight:       123.0 lb Date of Birth:  Feb 10, 1961       BSA:          1.591 m Patient Age:    63  years        BP:           110/68 mmHg Patient Gender: F               HR:           72 bpm. Exam Location:  [facility] Procedure: 2D Echo, Cardiac Doppler and Color Doppler (Both Spectral and Color            Flow Doppler were utilized during procedure). Indications:     I21.9 Myocardial Infarction  History:         Patient has no prior history of Echocardiogram examinations.                  Risk Factors:Dyslipidemia.  Sonographer:     Daphine Deutscher RDCS Referring Phys:  [id number] Diagnosing Phys: Yvonne Kendall MD IMPRESSIONS  1. Left ventricular ejection fraction, by estimation, is 35 to 40%. The left ventricle has moderately decreased function. The left ventricle demonstrates regional wall motion abnormalities (see scoring diagram/findings for description). There is mild asymmetric left ventricular hypertrophy of the basal-septal segment. Left ventricular diastolic parameters are consistent with Grade II diastolic dysfunction (pseudonormalization).  2. Right ventricular systolic function is low normal. The right ventricular size is normal. There is normal pulmonary artery systolic pressure.  3. The mitral valve is normal in structure. Mild mitral valve regurgitation. No evidence of mitral stenosis.  4. The aortic valve is tricuspid. Aortic valve regurgitation is not visualized. No aortic stenosis is present.  5. The inferior vena cava is normal in size with greater than 50% respiratory variability, suggesting right atrial pressure of 3 mmHg. FINDINGS  Left Ventricle: Left ventricular ejection fraction, by estimation, is 35 to 40%. The left ventricle has moderately decreased function. The left ventricle demonstrates regional wall motion abnormalities. Strain imaging was not performed. The left ventricular internal cavity size was normal in size. There is mild asymmetric left ventricular hypertrophy of the basal-septal segment. Left ventricular diastolic parameters are consistent with Grade II  diastolic dysfunction (pseudonormalization).  LV Wall Scoring: The apical septal segment, apical anterior segment, and apex are akinetic. The mid anteroseptal segment, mid inferoseptal segment, and apical inferior segment are hypokinetic. The anterior wall, entire lateral wall, inferior wall, basal anteroseptal segment, and basal inferoseptal segment are normal. Right Ventricle: The right ventricular size is normal. No increase in right ventricular wall thickness. Right  ventricular systolic function is low normal. There is normal pulmonary artery systolic pressure. The tricuspid regurgitant velocity is 2.33 m/s,  and with an assumed right atrial pressure of 3 mmHg, the estimated right ventricular systolic pressure is 24.7 mmHg. Left Atrium: Left atrial size was normal in size. Right Atrium: Right atrial size was normal in size. Pericardium: Trivial pericardial effusion is present. Mitral Valve: The mitral valve is normal in structure. Mild mitral valve regurgitation. No evidence of mitral valve stenosis. Tricuspid Valve: The tricuspid valve is normal in structure. Tricuspid valve regurgitation is mild. Aortic Valve: The aortic valve is tricuspid. Aortic valve regurgitation is not visualized. No aortic stenosis is present. Aortic valve mean gradient measures 2.8 mmHg. Aortic valve peak gradient measures 5.3 mmHg. Aortic valve area, by VTI measures 2.25 cm. Pulmonic Valve: The pulmonic valve was normal in structure. Pulmonic valve regurgitation is trivial. Aorta: The aortic root is normal in size and structure. Venous: The inferior vena cava is normal in size with greater than 50% respiratory variability, suggesting right atrial pressure of 3 mmHg. IAS/Shunts: The interatrial septum was not well visualized. Additional Comments: 3D imaging was not performed.  LEFT VENTRICLE PLAX 2D LVIDd:         4.50 cm     Diastology LVIDs:         3.20 cm     LV e' medial:    7.13 cm/s LV PW:         1.00 cm     LV E/e' medial:   10.8 LV IVS:        0.80 cm     LV e' lateral:   7.99 cm/s LVOT diam:     1.80 cm     LV E/e' lateral: 9.6 LV SV:         49 LV SV Index:   31 LVOT Area:     2.54 cm  LV Volumes (MOD) LV vol d, MOD A2C: 75.3 ml LV vol d, MOD A4C: 66.0 ml LV vol s, MOD A2C: 39.0 ml LV vol s, MOD A4C: 35.1 ml LV SV MOD A2C:     36.3 ml LV SV MOD A4C:     66.0 ml LV SV MOD BP:      34.6 ml RIGHT VENTRICLE             IVC RV Basal diam:  3.80 cm     IVC diam: 1.30 cm RV S prime:     10.95 cm/s TAPSE (M-mode): 1.7 cm LEFT ATRIUM             Index        RIGHT ATRIUM           Index LA diam:        4.10 cm 2.58 cm/m   RA Area:     11.20 cm LA Vol (A2C):   35.3 ml 22.18 ml/m  RA Volume:   24.60 ml  15.46 ml/m LA Vol (A4C):   34.4 ml 21.62 ml/m LA Biplane Vol: 35.3 ml 22.18 ml/m  AORTIC VALVE AV Area (Vmax):    2.07 cm AV Area (Vmean):   1.99 cm AV Area (VTI):     2.25 cm AV Vmax:           115.50 cm/s AV Vmean:          79.628 cm/s AV VTI:            0.219 m AV Peak Grad:  5.3 mmHg AV Mean Grad:      2.8 mmHg LVOT Vmax:         94.13 cm/s LVOT Vmean:        62.267 cm/s LVOT VTI:          0.193 m LVOT/AV VTI ratio: 0.88  AORTA Ao Root diam: 3.10 cm MITRAL VALVE               TRICUSPID VALVE MV Area (PHT): 3.56 cm    TR Peak grad:   21.7 mmHg MV Decel Time: 213 msec    TR Vmax:        233.00 cm/s MV E velocity: 77.00 cm/s MV A velocity: 68.15 cm/s  SHUNTS MV E/A ratio:  1.13        Systemic VTI:  0.19 m                            Systemic Diam: 1.80 cm Yvonne Kendall MD Electronically signed by Yvonne Kendall MD Signature Date/Time: 01/11/2024/5:21:59 AM    Final    CARDIAC CATHETERIZATION Result Date: 01/10/2024 Conclusions: Severe single-vessel coronary artery disease with thrombotic occlusion of the proximal LAD.  There is also mild, nonobstructive disease involving the proximal LCx and mid RCA. Myocardial bridging of the distal LAD noted. Severely reduced left ventricular systolic function (LVEF 25-35% with mid/apical  hypokinesis/akinesis). Mildly elevated left ventricular filling pressure (LVEDP 18 mmHg, 25 mmHg post a-wave). Successful PCI to proximal LAD using Onyx Frontier 2.75 x 18 mm drug-eluting stent (postdilated to 3.3 mm) with 0% residual stenosis and TIMI-3 flow. Recommendations: Continue cangrelor infusion for 2 hours after ticagrelor load at the end of PCI. Dual antiplatelet therapy with aspirin and ticagrelor for at least 12 months. Aggressive secondary prevention; patient has been intolerant of simvastatin in the past and is reluctant to be rechallenged with statins.  If she does not wish to try a statin again, outpatient initiation of PCSK9 inhibitor, inclisiran, or bempedoic acid/ezetimibe will need to be considered. Follow-up echocardiogram. Maintain net even to slightly negative fluid balance. Initiate goal-directed medical therapy, as tolerated.  I will add low-dose metoprolol succinate to begin this evening. Yvonne Kendall, MD [facility]   DG Chest 2 View Result Date: 01/10/2024 CLINICAL DATA:  Mid chest pain. EXAM: CHEST - 2 VIEW COMPARISON:  12/11/2014. FINDINGS: Bilateral lung fields are clear. Bilateral costophrenic angles are clear. Normal cardio-mediastinal silhouette. No acute osseous abnormalities. The soft tissues are within normal limits. IMPRESSION: No active cardiopulmonary disease. Electronically Signed   By: Jules Schick M.D.   On: 01/10/2024 14:25    EKG: 01/11/24 at 0130 - SR with ST elevation in II, AVLV4, V5 (personally reviewed)  TELEMETRY: Long-short torsades that devolves into VF (personally reviewed)    Assessment/Plan: #) Torsades de Pointes #) VF #) STEMI #) CAD s/p PCI Patient [facility] 2/25 with late-presenting STEMI. She is s/p LAD PCI. Post-procedurally, she developed 3 separate episodes of long-short TDP with the 1st and 3rd episode requiring defib x 1 each. She was started on IV amiodarone and IV lidocaine. Since those TDP episodes, tele has  remained quiescent. -Continue IV amiodarone at 30mg /hr throughout today -Continue lidocaine 4mg /hr throughout today -Consider weaning lidocaine tomorrow Keep K > 4, Mag > 2   If VF recurs, would favor temp wire with overdrive pacing and tx to [facility]           For questions or updates,  please contact [facility] Please consult [email or web address] for contact info under Cardiology/STEMI.  Signed, Sherie Don, NP  01/11/2024 7:49 AM

## 2024-01-11 NOTE — Progress Notes (Signed)
       CROSS COVER NOTE  NAME: Leslie Shepard MRN: [id number] DOB : Jun 22, 1961    Date of Service   01/11/2024   HPI/Events of Note   Nursing called a CODE BLUE after patient was noticed to be in V-fib with very faint pulse.  Patient reportedly was altered during this episode and minimally responsive.  Patient was placed on the pads and was shocked with 200 J by the ICU APP.  Patient was awake after this episode and fully responsive.  Interventions   - Labs ordered and sent. -Spoke with on-call cardiologist who recommended placing patient on an amiodarone drip. -Patient wanted her significant other updated.  This NP called patient's family gave update. -Patient remained in ICU bed 9.     UPDATE:  Patient had 2 more subsequent episodes of tachycardia resembling torsades on the monitor.  Patient was shocked a second time and she came out of the rhythm.  Patient alert and oriented.  Cardiology was consulted again by this NP.  And Dr. Elwyn Lade recommended giving the patient a bolus of lidocaine and putting her on a lidocaine drip in addition to the amiodarone.  Patient was given magnesium sulfate and potassium was replaced as well.  Leslie Lovins Lamin Geradine Girt, MSN, APRN, AGACNP-BC [facility] [place] Pager: [phone]. Check Amion for Availability

## 2024-01-11 NOTE — Assessment & Plan Note (Signed)
 Received magnesium.  On antiarrhythmics.  Replace magnesium again today

## 2024-01-11 NOTE — Assessment & Plan Note (Signed)
 Likely secondary to STEMI.  AST down to 77 and ALT down to 80.

## 2024-01-11 NOTE — Assessment & Plan Note (Signed)
 EF on echocardiogram 35 to 40%.  May end up needing a LifeVest prior to disposition.  Patient on low-dose Toprol-XL

## 2024-01-11 NOTE — Consult Note (Addendum)
 Advanced Heart Failure Team Consult Note   Primary Physician: Emily Filbert, MD Cardiologist:  Verne Carrow, MD  Reason for Consultation: Acute Systolic Heart Failure/Ischemic CM, Post In Hospital VT/VF Arrest   HPI:    Leslie Shepard is seen today for evaluation of acute systolic heart failure due to ICM/ Post VT/VF Arrest at the request of Dr. Okey Dupre, Cardiology.   63 y/o female, from [place], w/ untreated HLD due to statin intolerance who was referred recently to cardiology for chest pain evaluation 12/24. She was ordered to get a coronary CTA and echo, however pt never followed up.   She presented to the ED yesterday w/ unstable angina. Initial HS trop only 64 but she continued w/ ongoing CP despite heparin gtt and developed progressive STEs on serial EKGs w/ Hs trop bumping to 310>> > 24K. CODE STEMI activated. Emergent cath showed severe single-vessel CAD w/ thrombotic occlusion of the pLAD, treated w/ PCI + DES. There is also mild nonobstructive dz involving the pLCx and mRCA to be treated medically. LVG showed severely reduced LVEF, 25-35% w/ mild/apical HK/AK. LVEDP was mildly elevated at 18 mmHg. 2D Echo EF 35-40%, mild LVH at basal-septal segment, GIIDD, RV low normal.   She was admitted to the ICU and overnight went into V-fib arrest w/ ROSC after defib x 1 + ~2 min of CPR. She was placed on an amio gtt and then shortly after had recurrent PMVT x 2 treated w/ defib and IV Mg.  Post arrest EKG showed improved ST segments. QTc 449 ms. K was low at 3.2, Mg 2.2. She was given IV K supp and started on lidocaine gtt. Lactic acid post arrest 3.7.   Today pt is alert and oriented. No further CP. O2 sats 99% on RA. Remains on amio gtt at 30/hr + lidocaine gtt at 1 mcg/min. Rhythm stable on tele, NSR. HR 70s. VT quiescent. Most recent BMP obtained at 4 AM showed K 4.3, SCr 0.68.  She is very appreciative of care received.   Pt denies any family h/o SCD.   Echo 01/10/24 1.  Left ventricular ejection fraction, by estimation, is 35 to 40%. The  left ventricle has moderately decreased function. The left ventricle  demonstrates regional wall motion abnormalities (see scoring  diagram/findings for description). There is mild  asymmetric left ventricular hypertrophy of the basal-septal segment. Left  ventricular diastolic parameters are consistent with Grade II diastolic  dysfunction (pseudonormalization).   2. Right ventricular systolic function is low normal. The right  ventricular size is normal. There is normal pulmonary artery systolic  pressure.   3. The mitral valve is normal in structure. Mild mitral valve  regurgitation. No evidence of mitral stenosis.   4. The aortic valve is tricuspid. Aortic valve regurgitation is not  visualized. No aortic stenosis is present.   5. The inferior vena cava is normal in size with greater than 50%  respiratory variability, suggesting right atrial pressure of 3 mmHg.   Home Medications Prior to Admission medications   Medication Sig Start Date End Date Taking? Authorizing Provider  metoprolol tartrate (LOPRESSOR) 100 MG tablet Take 1 tablet by mouth 2 hours before CT scan 10/17/23   Kathleene Hazel, MD  rosuvastatin (CRESTOR) 10 MG tablet Take 1 tablet (10 mg total) by mouth daily. In evening with a low fat snack 10/11/23   Tower, Audrie Gallus, MD    Past Medical History: Past Medical History:  Diagnosis Date   Hyperlipidemia  PMDD (premenstrual dysphoric disorder)     Past Surgical History: Past Surgical History:  Procedure Laterality Date   CORONARY/GRAFT ACUTE MI REVASCULARIZATION N/A 01/10/2024   Procedure: Coronary/Graft Acute MI Revascularization;  Surgeon: Yvonne Kendall, MD;  Location: [facility] INVASIVE CV LAB;  Service: Cardiovascular;  Laterality: N/A;   LEFT HEART CATH AND CORONARY ANGIOGRAPHY N/A 01/10/2024   Procedure: LEFT HEART CATH AND CORONARY ANGIOGRAPHY;  Surgeon: Yvonne Kendall, MD;   Location: [facility] INVASIVE CV LAB;  Service: Cardiovascular;  Laterality: N/A;   none      Family History: Family History  Problem Relation Age of Onset   Hyperlipidemia Mother    Seizures Father        epilepsy   Other Sister        carotid stenosis, smoker   Hyperlipidemia Sister    Bulemia Sister    Breast cancer Maternal Aunt        M 1/2 Aunt ?   Depression Other        grandparents    Social History: Social History   Socioeconomic History   Marital status: Married    Spouse name: Not on file   Number of children: 2   Years of education: Not on file   Highest education level: Not on file  Occupational History   Occupation: Works in Fish farm manager houses  Tobacco Use   Smoking status: Never   Smokeless tobacco: Never  Substance and Sexual Activity   Alcohol use: Yes    Comment: 1-2 drinks per week   Drug use: No   Sexual activity: Yes    Birth control/protection: Post-menopausal  Other Topics Concern   Not on file  Social History Narrative   Last updated: 11/13/2008   Never Smoked   Alcohol use-yes/ occasional   Drug use-no   exercise- running/ biking          Social Drivers of Health   Financial Resource Strain: Patient Declined (10/11/2023)   Overall Financial Resource Strain (CARDIA)    Difficulty of Paying Living Expenses: Patient declined  Food Insecurity: No Food Insecurity (01/10/2024)   Hunger Vital Sign    Worried About Running Out of Food in the Last Year: Never true    Ran Out of Food in the Last Year: Never true  Transportation Needs: No Transportation Needs (01/10/2024)   PRAPARE - Administrator, Civil Service (Medical): No    Lack of Transportation (Non-Medical): No  Physical Activity: Sufficiently Active (10/11/2023)   Exercise Vital Sign    Days of Exercise per Week: 4 days    Minutes of Exercise per Session: 50 min  Stress: No Stress Concern Present (10/11/2023)   [employer] of Occupational Health - Occupational Stress  Questionnaire    Feeling of Stress : Not at all  Social Connections: Unknown (10/11/2023)   Social Connection and Isolation Panel [NHANES]    Frequency of Communication with Friends and Family: Patient declined    Frequency of Social Gatherings with Friends and Family: Patient declined    Attends Religious Services: Patient declined    Database administrator or Organizations: Patient declined    Attends Banker Meetings: Not on file    Marital Status: Married    Allergies:  Allergies  Allergen Reactions   Statins     Malaise and muscle pain     Objective:    Vital Signs:   Temp:  [97.5 F (36.4 C)-98 F (36.7 C)] 97.5 F (36.4 C) (02/26  0400) Pulse Rate:  [59-92] 67 (02/26 0800) Resp:  [9-27] 16 (02/26 0800) BP: (44-142)/(14-112) 118/80 (02/26 0800) SpO2:  [92 %-100 %] 96 % (02/26 0800) Weight:  [55.8 kg] 55.8 kg (02/25 1310) Last BM Date :  (PTA)  Weight change: Filed Weights   01/10/24 1310  Weight: 55.8 kg    Intake/Output:   Intake/Output Summary (Last 24 hours) at 01/11/2024 0918 Last data filed at 01/11/2024 0400 Gross per 24 hour  Intake 566.22 ml  Output 1050 ml  Net -483.78 ml      Physical Exam    General:  Well appearing, thin female. No resp difficulty HEENT: normal Neck: supple. JVP not elevated . Carotids 2+ bilat; no bruits. No lymphadenopathy or thyromegaly appreciated. Cor: PMI nondisplaced. Regular rate & rhythm. No rubs, gallops or murmurs. + Zoll PADs  Lungs: clear Abdomen: soft, nontender, nondistended. Good bowel sounds. Extremities: no cyanosis, clubbing, rash, edema Neuro: alert & orientedx3, cranial nerves grossly intact. moves all 4 extremities w/o difficulty. Affect pleasant   Telemetry   NSR 70s, no further VT /VF  EKG    NSR 68 bpm w/ persistent inferior STE though improved from STEMI EKG, QTc 433 ms   Labs   Basic Metabolic Panel: Recent Labs  Lab 01/10/24 1311 01/11/24 0001 01/11/24 0351  NA 136  132* 131*  K 3.6 3.2* 4.3  CL 102 101 102  CO2 23 18* 21*  GLUCOSE 120* 182* 161*  BUN 15 15 14   CREATININE 0.72 1.03* 0.68  CALCIUM 10.1 9.3 8.7*  MG  --  2.2  --     Liver Function Tests: Recent Labs  Lab 01/10/24 1840 01/11/24 0001  AST 232* 504*  ALT 51* 173*  ALKPHOS 46 50  BILITOT 1.1 1.1  PROT 6.3* 6.2*  ALBUMIN 4.1 3.9   No results for input(s): "LIPASE", "AMYLASE" in the last 168 hours. No results for input(s): "AMMONIA" in the last 168 hours.  CBC: Recent Labs  Lab 01/10/24 1311 01/11/24 0001 01/11/24 0351  WBC 6.2 9.3 8.8  HGB 13.4 13.0 12.1  HCT 39.9 37.5 34.7*  MCV 91.5 88.7 87.6  PLT 233 215 192    Cardiac Enzymes: No results for input(s): "CKTOTAL", "CKMB", "CKMBINDEX", "TROPONINI" in the last 168 hours.  BNP: BNP (last 3 results) No results for input(s): "BNP" in the last 8760 hours.  ProBNP (last 3 results) No results for input(s): "PROBNP" in the last 8760 hours.   CBG: Recent Labs  Lab 01/10/24 1822  GLUCAP 76    Coagulation Studies: Recent Labs    01/10/24 1840  LABPROT 14.7  INR 1.1     Imaging   ECHOCARDIOGRAM COMPLETE Result Date: 01/11/2024    ECHOCARDIOGRAM REPORT   Patient Name:   Leslie Shepard Date of Exam: 01/10/2024 Medical Rec #:  [id number]       Height:       64.0 in Accession #:    [id number]      Weight:       123.0 lb Date of Birth:  Nov 27, 1960       BSA:          1.591 m Patient Age:    63 years        BP:           110/68 mmHg Patient Gender: F               HR:           72  bpm. Exam Location:  [facility] Procedure: 2D Echo, Cardiac Doppler and Color Doppler (Both Spectral and Color            Flow Doppler were utilized during procedure). Indications:     I21.9 Myocardial Infarction  History:         Patient has no prior history of Echocardiogram examinations.                  Risk Factors:Dyslipidemia.  Sonographer:     Daphine Deutscher RDCS Referring Phys:  [id number] CHRISTOPHER END Diagnosing Phys: Yvonne Kendall MD IMPRESSIONS  1. Left ventricular ejection fraction, by estimation, is 35 to 40%. The left ventricle has moderately decreased function. The left ventricle demonstrates regional wall motion abnormalities (see scoring diagram/findings for description). There is mild asymmetric left ventricular hypertrophy of the basal-septal segment. Left ventricular diastolic parameters are consistent with Grade II diastolic dysfunction (pseudonormalization).  2. Right ventricular systolic function is low normal. The right ventricular size is normal. There is normal pulmonary artery systolic pressure.  3. The mitral valve is normal in structure. Mild mitral valve regurgitation. No evidence of mitral stenosis.  4. The aortic valve is tricuspid. Aortic valve regurgitation is not visualized. No aortic stenosis is present.  5. The inferior vena cava is normal in size with greater than 50% respiratory variability, suggesting right atrial pressure of 3 mmHg. FINDINGS  Left Ventricle: Left ventricular ejection fraction, by estimation, is 35 to 40%. The left ventricle has moderately decreased function. The left ventricle demonstrates regional wall motion abnormalities. Strain imaging was not performed. The left ventricular internal cavity size was normal in size. There is mild asymmetric left ventricular hypertrophy of the basal-septal segment. Left ventricular diastolic parameters are consistent with Grade II diastolic dysfunction (pseudonormalization).  LV Wall Scoring: The apical septal segment, apical anterior segment, and apex are akinetic. The mid anteroseptal segment, mid inferoseptal segment, and apical inferior segment are hypokinetic. The anterior wall, entire lateral wall, inferior wall, basal anteroseptal segment, and basal inferoseptal segment are normal. Right Ventricle: The right ventricular size is normal. No increase in right ventricular wall thickness. Right ventricular systolic function is low normal. There is normal  pulmonary artery systolic pressure. The tricuspid regurgitant velocity is 2.33 m/s,  and with an assumed right atrial pressure of 3 mmHg, the estimated right ventricular systolic pressure is 24.7 mmHg. Left Atrium: Left atrial size was normal in size. Right Atrium: Right atrial size was normal in size. Pericardium: Trivial pericardial effusion is present. Mitral Valve: The mitral valve is normal in structure. Mild mitral valve regurgitation. No evidence of mitral valve stenosis. Tricuspid Valve: The tricuspid valve is normal in structure. Tricuspid valve regurgitation is mild. Aortic Valve: The aortic valve is tricuspid. Aortic valve regurgitation is not visualized. No aortic stenosis is present. Aortic valve mean gradient measures 2.8 mmHg. Aortic valve peak gradient measures 5.3 mmHg. Aortic valve area, by VTI measures 2.25 cm. Pulmonic Valve: The pulmonic valve was normal in structure. Pulmonic valve regurgitation is trivial. Aorta: The aortic root is normal in size and structure. Venous: The inferior vena cava is normal in size with greater than 50% respiratory variability, suggesting right atrial pressure of 3 mmHg. IAS/Shunts: The interatrial septum was not well visualized. Additional Comments: 3D imaging was not performed.  LEFT VENTRICLE PLAX 2D LVIDd:         4.50 cm     Diastology LVIDs:         3.20 cm  LV e' medial:    7.13 cm/s LV PW:         1.00 cm     LV E/e' medial:  10.8 LV IVS:        0.80 cm     LV e' lateral:   7.99 cm/s LVOT diam:     1.80 cm     LV E/e' lateral: 9.6 LV SV:         49 LV SV Index:   31 LVOT Area:     2.54 cm  LV Volumes (MOD) LV vol d, MOD A2C: 75.3 ml LV vol d, MOD A4C: 66.0 ml LV vol s, MOD A2C: 39.0 ml LV vol s, MOD A4C: 35.1 ml LV SV MOD A2C:     36.3 ml LV SV MOD A4C:     66.0 ml LV SV MOD BP:      34.6 ml RIGHT VENTRICLE             IVC RV Basal diam:  3.80 cm     IVC diam: 1.30 cm RV S prime:     10.95 cm/s TAPSE (M-mode): 1.7 cm LEFT ATRIUM             Index         RIGHT ATRIUM           Index LA diam:        4.10 cm 2.58 cm/m   RA Area:     11.20 cm LA Vol (A2C):   35.3 ml 22.18 ml/m  RA Volume:   24.60 ml  15.46 ml/m LA Vol (A4C):   34.4 ml 21.62 ml/m LA Biplane Vol: 35.3 ml 22.18 ml/m  AORTIC VALVE AV Area (Vmax):    2.07 cm AV Area (Vmean):   1.99 cm AV Area (VTI):     2.25 cm AV Vmax:           115.50 cm/s AV Vmean:          79.628 cm/s AV VTI:            0.219 m AV Peak Grad:      5.3 mmHg AV Mean Grad:      2.8 mmHg LVOT Vmax:         94.13 cm/s LVOT Vmean:        62.267 cm/s LVOT VTI:          0.193 m LVOT/AV VTI ratio: 0.88  AORTA Ao Root diam: 3.10 cm MITRAL VALVE               TRICUSPID VALVE MV Area (PHT): 3.56 cm    TR Peak grad:   21.7 mmHg MV Decel Time: 213 msec    TR Vmax:        233.00 cm/s MV E velocity: 77.00 cm/s MV A velocity: 68.15 cm/s  SHUNTS MV E/A ratio:  1.13        Systemic VTI:  0.19 m                            Systemic Diam: 1.80 cm Yvonne Kendall MD Electronically signed by Yvonne Kendall MD Signature Date/Time: 01/11/2024/5:21:59 AM    Final    CARDIAC CATHETERIZATION Result Date: 01/10/2024 Conclusions: Severe single-vessel coronary artery disease with thrombotic occlusion of the proximal LAD.  There is also mild, nonobstructive disease involving the proximal LCx and mid RCA. Myocardial bridging of the distal LAD noted. Severely reduced  left ventricular systolic function (LVEF 25-35% with mid/apical hypokinesis/akinesis). Mildly elevated left ventricular filling pressure (LVEDP 18 mmHg, 25 mmHg post a-wave). Successful PCI to proximal LAD using Onyx Frontier 2.75 x 18 mm drug-eluting stent (postdilated to 3.3 mm) with 0% residual stenosis and TIMI-3 flow. Recommendations: Continue cangrelor infusion for 2 hours after ticagrelor load at the end of PCI. Dual antiplatelet therapy with aspirin and ticagrelor for at least 12 months. Aggressive secondary prevention; patient has been intolerant of simvastatin in the past and is  reluctant to be rechallenged with statins.  If she does not wish to try a statin again, outpatient initiation of PCSK9 inhibitor, inclisiran, or bempedoic acid/ezetimibe will need to be considered. Follow-up echocardiogram. Maintain net even to slightly negative fluid balance. Initiate goal-directed medical therapy, as tolerated.  I will add low-dose metoprolol succinate to begin this evening. Yvonne Kendall, MD [facility]   DG Chest 2 View Result Date: 01/10/2024 CLINICAL DATA:  Mid chest pain. EXAM: CHEST - 2 VIEW COMPARISON:  12/11/2014. FINDINGS: Bilateral lung fields are clear. Bilateral costophrenic angles are clear. Normal cardio-mediastinal silhouette. No acute osseous abnormalities. The soft tissues are within normal limits. IMPRESSION: No active cardiopulmonary disease. Electronically Signed   By: Jules Schick M.D.   On: 01/10/2024 14:25     Medications:     Current Medications:  aspirin  81 mg Oral Daily   Chlorhexidine Gluconate Cloth  6 each Topical Daily   enoxaparin (LOVENOX) injection  40 mg Subcutaneous Q24H   sodium chloride flush  3 mL Intravenous Q12H   ticagrelor  90 mg Oral BID    Infusions:  sodium chloride     amiodarone 30 mg/hr (01/11/24 0626)   lidocaine 1 mg/min (01/11/24 0400)      Patient Profile   63 y/o [place] female w/ untreated HLD due to statin intolerance but no other PMH admitted w/ anterior STEMI 2/2 1V CAD w/ pLAD occlusion treated w/ PCI + DES w/ subsequent ischemic CM. Echo EF 35-40% w/ basal-septal segment LVH and low normal RV. Post revascularization developed Vfib Arrest x 2.   Assessment/Plan   1. Anterior STEMI/ CAD - Hs trop > 24000 - Cath 2/25 1V CAD, 100% pLAD occlusion s/p PCI + DES   - CP free - DAPT w/ ASA + Brilinta - statin intolerant, needs to be consider for PCSK9i (LDL 155)  2. In Hospital VT/VF Arrest  - post LAD PCI - Torsades x 3, 1 broke spontaneously, 2 episodes required defib + IV Mg and K (3.2) - post  arrest EKG QTc 449 ms  - on amio gtt at 30 + lidocaine gtt at 1 mcg/min. Rhythm now stable. VT quiescent. QTc on today's EKG 433 ms. EP following  - cont amio and lidocaine gtts per EP. Monitor lidocaine level  - keep K > 4.0 and Mg > 2.0   - suspect post reperfusion arrhthymias but given basal-septal LVH on echo, may consider cMRI to r/o possible sarcoid/other indication for ICD - EP team to determine ICD prior to d/c vs LifeVest   3. Acute Systolic Heart Failure - Echo 16-10% basal-septal segment LVH, RV mildly reduced  - Ischemic CM, now s/p LAD PCI - Euvolemic on exam. SBPs 110s. Will add GDMT slowly  - start spiro 12.5 mg daily  - consider cMRI as outlined above    Length of Stay: 1  Leslie Simmons, PA-C  01/11/2024, 9:18 AM  Advanced Heart Failure Team Pager [phone] (M-F; 7a - 5p)  Please contact [facility] for night-coverage after hours (4p -7a ) and weekends on [email or web address]  Agree with above.   63 y/o woman admitted with anterior MI underwent emergent PCI of LAD. Otherwise no other significant CAD. EF 35-40%  Overnight developed VT/VF/torsades requiring defib x 2. Seen by EP. Now on iv amio/lidocaine. VT quiescent. No evidence of ongoing ischemia.   Denies CP or SOB currently.   General:  Sitting up in be No resp difficulty HEENT: normal Neck: supple. no JVD. Carotids 2+ bilat; no bruits. No lymphadenopathy or thryomegaly appreciated. Cor: PMI nondisplaced. Regular rate & rhythm. No rubs, gallops or murmurs. Lungs: clear Abdomen: soft, nontender, nondistended. No hepatosplenomegaly. No bruits or masses. Good bowel sounds. Extremities: no cyanosis, clubbing, rash, edema Neuro: alert & orientedx3, cranial nerves grossly intact. moves all 4 extremities w/o difficulty. Affect pleasant  Patient currently electrically stable on amio/lido. Appreciate EP recs.  Will need aggressive titration of GDMT and LifeVest prior to d/c.   Will add spiro and losartan. Likely  SGLT2i tomorrow.   CRITICAL CARE Performed by: Arvilla Meres  Total critical care time: 50 minutes  Critical care time was exclusive of separately billable procedures and treating other patients.  Critical care was necessary to treat or prevent imminent or life-threatening deterioration.  Critical care was time spent personally by me (independent of midlevel providers or residents) on the following activities: development of treatment plan with patient and/or surrogate as well as nursing, discussions with consultants, evaluation of patient's response to treatment, examination of patient, obtaining history from patient or surrogate, ordering and performing treatments and interventions, ordering and review of laboratory studies, ordering and review of radiographic studies, pulse oximetry and re-evaluation of patient's condition.  Arvilla Meres, MD  5:42 PM

## 2024-01-11 NOTE — Progress Notes (Signed)
 While in another patient room, received a call from CCMD stating that the patient appears to be in Vfib. Upon entering patient's room, patient unresponsive to stimulation/vocalization by nurse. CODE BLUE activated and CPR performed with defibrillation x1 for ~2 minutes. ROSC achieved after defib and patient is alert and oriented at this time. No meds given or airway placed. See CODE sheet.  Carmel Sacramento, RN

## 2024-01-11 NOTE — Progress Notes (Signed)
   01/11/24 1400  Spiritual Encounters  Type of Visit Follow up  Care provided to: Patient  Referral source Chaplain team  Reason for visit Routine spiritual support  OnCall Visit No  Spiritual Framework  Presenting Themes Significant life change  Interventions  Spiritual Care Interventions Made Compassionate presence;Encouragement  Intervention Outcomes  Outcomes Awareness around self/spiritual resourses;Reduced anxiety  Spiritual Care Plan  Spiritual Care Issues Still Outstanding No further spiritual care needs at this time (see row info)

## 2024-01-11 NOTE — Progress Notes (Signed)
 Progress Note   Patient: Leslie Shepard:[id number] DOB: 08/29/1961 DOA: 01/10/2024     1 DOS: the patient was seen and examined on 01/11/2024   Brief hospital course: 63 year old female with past medical history of hyperlipidemia intolerant of statins and was very active coming into the emergency room with ongoing chest pain.  Patient was found to have a STEMI and brought to the cardiac Cath Lab and had a stent in the LAD.  EF on cath 25 to 30%  Overnight CODE BLUE was called secondary to ventricular fibrillation.  Patient was shocked and then became responsive.  Patient had 2 more episodes of potentially torsades on the monitor and shocked another time.  Received magnesium.  Patient was started on lidocaine drip and amiodarone.  Echocardiogram shows an EF of 35 to 40%.  2/26.  Patient feeling better on amiodarone and amiodarone drip.  Seen by EP specialist and cardiology.  Replace potassium.  Assessment and Plan: * STEMI (ST elevation myocardial infarction) (HCC) Status post stent in the LAD.  Patient on Brilinta, aspirin.  Ventricular fibrillation (HCC) Patient on amiodarone and lidocaine drips  Torsades de pointes (HCC) Patient received magnesium.  Will replace potassium.  Patient on amiodarone and lidocaine drips.  Ischemic cardiomyopathy EF on echocardiogram 35 to 40%.  May end up needing a LifeVest prior to disposition.  Patient on low-dose Aldactone.  Addition of other medications will be dependent on blood pressure  Hypokalemia Replace potassium  AKI (acute kidney injury) (HCC) Creatinine is low at 0.68 this morning and up to 1.09  Elevated liver function tests Likely secondary to STEMI  Hyperlipidemia, unspecified LDL 155.  Patient intolerant to statins.        Subjective: Patient admitted with STEMI.  Overnight had ventricular fibrillation requiring shocks.  Patient now on amiodarone and lidocaine drips.  Patient does have some chest pain but thinks it may be  secondary to be shocks.  Physical Exam: Vitals:   01/11/24 0615 01/11/24 0700 01/11/24 0800 01/11/24 0905  BP:  113/76 118/80 119/73  Pulse: 67 71 67 69  Resp: (!) 9 19 16 13   Temp:   98.4 F (36.9 C)   TempSrc:   Oral   SpO2: 97% 99% 96% 99%  Weight:      Height:       Physical Exam HENT:     Head: Normocephalic.     Mouth/Throat:     Pharynx: No oropharyngeal exudate.  Eyes:     General: Lids are normal.     Conjunctiva/sclera: Conjunctivae normal.  Cardiovascular:     Rate and Rhythm: Normal rate and regular rhythm.     Heart sounds: Normal heart sounds, S1 normal and S2 normal.  Pulmonary:     Breath sounds: No decreased breath sounds, wheezing, rhonchi or rales.  Abdominal:     Palpations: Abdomen is soft.     Tenderness: There is no abdominal tenderness.  Musculoskeletal:     Right lower leg: No swelling.     Left lower leg: No swelling.  Skin:    General: Skin is warm.     Findings: No rash.  Neurological:     Mental Status: She is alert and oriented to person, place, and time.    Data Reviewed: Sodium 133, potassium 3.3, creatinine 1.09, magnesium 2.2, AST 504, ALT 173, LDL 155, white blood cell count 8.8, hemoglobin 12.1, platelet count 192  Family Communication: Spoke with husband at the bedside  Disposition: Status is: Inpatient Remains  inpatient appropriate because: Continue lidocaine and amiodarone drip for ventricular fibrillation.  Will likely need a LifeVest prior to disposition.  Planned Discharge Destination: Home    Time spent: 29 minutes Case discussed with cardiology and nursing staff.  Author: Alford Highland, MD 01/11/2024 11:11 AM  For on call review [email or web address].

## 2024-01-12 ENCOUNTER — Inpatient Hospital Stay: Payer: 59

## 2024-01-12 ENCOUNTER — Other Ambulatory Visit (HOSPITAL_COMMUNITY): Payer: Self-pay

## 2024-01-12 ENCOUNTER — Telehealth (HOSPITAL_COMMUNITY): Payer: Self-pay | Admitting: Pharmacy Technician

## 2024-01-12 DIAGNOSIS — I469 Cardiac arrest, cause unspecified: Secondary | ICD-10-CM

## 2024-01-12 DIAGNOSIS — E8721 Acute metabolic acidosis: Secondary | ICD-10-CM | POA: Insufficient documentation

## 2024-01-12 DIAGNOSIS — E871 Hypo-osmolality and hyponatremia: Secondary | ICD-10-CM | POA: Insufficient documentation

## 2024-01-12 DIAGNOSIS — I255 Ischemic cardiomyopathy: Secondary | ICD-10-CM | POA: Diagnosis not present

## 2024-01-12 DIAGNOSIS — I2102 ST elevation (STEMI) myocardial infarction involving left anterior descending coronary artery: Secondary | ICD-10-CM | POA: Diagnosis not present

## 2024-01-12 DIAGNOSIS — I4721 Torsades de pointes: Secondary | ICD-10-CM | POA: Diagnosis not present

## 2024-01-12 DIAGNOSIS — R7989 Other specified abnormal findings of blood chemistry: Secondary | ICD-10-CM

## 2024-01-12 DIAGNOSIS — N179 Acute kidney failure, unspecified: Secondary | ICD-10-CM

## 2024-01-12 LAB — CBC
HCT: 33.9 % — ABNORMAL LOW (ref 36.0–46.0)
Hemoglobin: 11.9 g/dL — ABNORMAL LOW (ref 12.0–15.0)
MCH: 30.9 pg (ref 26.0–34.0)
MCHC: 35.1 g/dL (ref 30.0–36.0)
MCV: 88.1 fL (ref 80.0–100.0)
Platelets: 182 10*3/uL (ref 150–400)
RBC: 3.85 MIL/uL — ABNORMAL LOW (ref 3.87–5.11)
RDW: 13.3 % (ref 11.5–15.5)
WBC: 7.6 10*3/uL (ref 4.0–10.5)
nRBC: 0 % (ref 0.0–0.2)

## 2024-01-12 LAB — COMPREHENSIVE METABOLIC PANEL
ALT: 105 U/L — ABNORMAL HIGH (ref 0–44)
AST: 159 U/L — ABNORMAL HIGH (ref 15–41)
Albumin: 3.4 g/dL — ABNORMAL LOW (ref 3.5–5.0)
Alkaline Phosphatase: 40 U/L (ref 38–126)
Anion gap: 8 (ref 5–15)
BUN: 12 mg/dL (ref 8–23)
CO2: 21 mmol/L — ABNORMAL LOW (ref 22–32)
Calcium: 8.9 mg/dL (ref 8.9–10.3)
Chloride: 95 mmol/L — ABNORMAL LOW (ref 98–111)
Creatinine, Ser: 0.68 mg/dL (ref 0.44–1.00)
GFR, Estimated: >60 mL/min (ref >60)
Glucose, Bld: 122 mg/dL — ABNORMAL HIGH (ref 70–99)
Potassium: 4 mmol/L (ref 3.5–5.1)
Sodium: 124 mmol/L — ABNORMAL LOW (ref 135–145)
Total Bilirubin: 0.5 mg/dL (ref 0.0–1.2)
Total Protein: 5.8 g/dL — ABNORMAL LOW (ref 6.5–8.1)

## 2024-01-12 LAB — SODIUM, URINE, RANDOM: Sodium, Ur: 41 mmol/L

## 2024-01-12 LAB — MAGNESIUM: Magnesium: 1.8 mg/dL (ref 1.7–2.4)

## 2024-01-12 LAB — OSMOLALITY, URINE: Osmolality, Ur: 288 mosm/kg — ABNORMAL LOW (ref 300–900)

## 2024-01-12 MED ORDER — MEXILETINE HCL 150 MG PO CAPS
150.0000 mg | ORAL_CAPSULE | Freq: Two times a day (BID) | ORAL | Status: DC
  Administered 2024-01-12 – 2024-01-14 (×5): 150 mg via ORAL
  Filled 2024-01-12 (×6): qty 1

## 2024-01-12 MED ORDER — EZETIMIBE 10 MG PO TABS
10.0000 mg | ORAL_TABLET | Freq: Every day | ORAL | Status: DC
  Administered 2024-01-12 – 2024-01-14 (×3): 10 mg via ORAL
  Filled 2024-01-12 (×3): qty 1

## 2024-01-12 MED ORDER — MAGNESIUM SULFATE 2 GM/50ML IV SOLN
2.0000 g | Freq: Once | INTRAVENOUS | Status: AC
  Administered 2024-01-12: 2 g via INTRAVENOUS
  Filled 2024-01-12: qty 50

## 2024-01-12 MED ORDER — ROSUVASTATIN CALCIUM 10 MG PO TABS
5.0000 mg | ORAL_TABLET | Freq: Every day | ORAL | Status: DC
  Administered 2024-01-12 – 2024-01-14 (×3): 5 mg via ORAL
  Filled 2024-01-12 (×4): qty 1

## 2024-01-12 MED ORDER — POLYETHYLENE GLYCOL 3350 17 G PO PACK: 17.0000 g | PACK | Freq: Every day | ORAL | Status: DC | PRN

## 2024-01-12 NOTE — Assessment & Plan Note (Signed)
 On presentation with STEMI.

## 2024-01-12 NOTE — Progress Notes (Addendum)
 EP brief progress note -   She is feeling much better, very appreciative of care received. She continues to have chest discomfort, especially with deep breaths, feels like she can't get a deep breath in.  Otherwise, without complaints.   Electrically quiet on tele  Will transition lidocaine gtt to mexiletine 150mg  BID Continue IV amiodarone at 30mg /hr  CXR to eval ongoing chest discomfort iso chest compression    Planning for lifevest at discharge, will msg reps to begin process.   In my professional opinion, the patient's LVEF = 35%.     Sherie Don, NP Electrophysiology 01/12/24 8:26 AM

## 2024-01-12 NOTE — Telephone Encounter (Signed)
 Patient Product/process development scientist completed.    The patient is insured through [employer]. Patient has [employer], may use a copay card, and/or apply for patient assistance if available.    Ran test claim for mexiletine 150 mg and Product Not Covered/Not on Formulary   This test claim was processed through [facility]- copay amounts may vary at other pharmacies due to [employer], or as the patient moves through the different stages of their insurance plan.     Roland Earl, CPHT Pharmacy Technician III Certified Patient Advocate [facility] Patient Advocate Team Direct Number: [phone]  Fax: [phone]

## 2024-01-12 NOTE — Progress Notes (Signed)
 Rounding Note    Patient Name: Leslie Shepard Date of Encounter: 01/12/2024  [facility] Cardiologist: Verne Carrow, MD   Subjective   Patient reports some ongoing chest discomfort. Telemetry shows sinus rhythm with occasional PVC. No further arrhythmia.   Inpatient Medications    Scheduled Meds:  aspirin  81 mg Oral Daily   Chlorhexidine Gluconate Cloth  6 each Topical Daily   enoxaparin (LOVENOX) injection  40 mg Subcutaneous Q24H   metoprolol succinate  12.5 mg Oral Daily   mexiletine  150 mg Oral Q12H   prasugrel  60 mg Oral Once   Followed by   [START ON 01/13/2024] prasugrel  5 mg Oral Daily   sodium chloride flush  3 mL Intravenous Q12H   Continuous Infusions:  amiodarone 30 mg/hr (01/12/24 0536)   lidocaine 1 mg/min (01/12/24 0536)   magnesium sulfate bolus IVPB     PRN Meds: acetaminophen **OR** acetaminophen, morphine injection, ondansetron **OR** ondansetron (ZOFRAN) IV, mouth rinse, oxyCODONE, sodium chloride flush, traZODone   Vital Signs    Vitals:   01/12/24 0400 01/12/24 0432 01/12/24 0500 01/12/24 0800  BP: 106/69 106/69 102/69 121/77  Pulse: (!) 55 (!) 58 (!) 56 (!) 59  Resp: 17 12 14 16   Temp: 98 F (36.7 C)     TempSrc: Oral     SpO2: 94% 96% 95% 97%  Weight:      Height:        Intake/Output Summary (Last 24 hours) at 01/12/2024 0813 Last data filed at 01/12/2024 0300 Gross per 24 hour  Intake 1501.37 ml  Output 2200 ml  Net -698.63 ml      01/10/2024    1:10 PM 10/17/2023    1:21 PM 10/11/2023   11:28 AM  Last 3 Weights  Weight (lbs) 123 lb 144 lb 146 lb 4 oz  Weight (kg) 55.792 kg 65.318 kg 66.339 kg      Telemetry    Sinus rhythm with occasional PVCs - Personally Reviewed  Physical Exam   GEN: No acute distress.   Neck: No JVD Cardiac: RRR, no murmurs, rubs, or gallops.  Respiratory: Clear to auscultation bilaterally. GI: Soft, nontender, non-distended  MS: No edema; No deformity. Neuro:  Nonfocal   Psych: Normal affect   Labs    High Sensitivity Troponin:   Recent Labs  Lab 01/10/24 1311 01/10/24 1524 01/10/24 1840  TROPONINIHS 64* 310* >24,000*     Chemistry Recent Labs  Lab 01/10/24 1840 01/11/24 0001 01/11/24 0351 01/11/24 0942 01/11/24 1957 01/12/24 0412  NA  --  132*   < > 133* 129* 124*  K  --  3.2*   < > 3.3* 4.1 4.0  CL  --  101   < > 101 98 95*  CO2  --  18*   < > 20* 22 21*  GLUCOSE  --  182*   < > 177* 108* 122*  BUN  --  15   < > 16 11 12   CREATININE  --  1.03*   < > 1.09* 0.69 0.68  CALCIUM  --  9.3   < > 9.2 9.2 8.9  MG  --  2.2  --   --   --  1.8  PROT 6.3* 6.2*  --   --   --  5.8*  ALBUMIN 4.1 3.9  --  4.0 3.7 3.4*  AST 232* 504*  --   --   --  159*  ALT 51* 173*  --   --   --  105*  ALKPHOS 46 50  --   --   --  40  BILITOT 1.1 1.1  --   --   --  0.5  GFRNONAA  --  >60   < > 57* >60 >60  ANIONGAP  --  13   < > 12 9 8    < > = values in this interval not displayed.    Lipids  Recent Labs  Lab 01/11/24 0351  CHOL 229*  TRIG 68  HDL 60  LDLCALC 155*  CHOLHDL 3.8    Hematology Recent Labs  Lab 01/11/24 0001 01/11/24 0351 01/12/24 0412  WBC 9.3 8.8 7.6  RBC 4.23 3.96 3.85*  HGB 13.0 12.1 11.9*  HCT 37.5 34.7* 33.9*  MCV 88.7 87.6 88.1  MCH 30.7 30.6 30.9  MCHC 34.7 34.9 35.1  RDW 13.2 13.2 13.3  PLT 215 192 182   Thyroid No results for input(s): "TSH", "FREET4" in the last 168 hours.  BNPNo results for input(s): "BNP", "PROBNP" in the last 168 hours.  DDimer No results for input(s): "DDIMER" in the last 168 hours.    Cardiac Studies   01/10/2024 LHC Severe single-vessel coronary artery disease with thrombotic occlusion of the proximal LAD.  There is also mild, nonobstructive disease involving the proximal LCx and mid RCA. Myocardial bridging of the distal LAD noted. Severely reduced left ventricular systolic function (LVEF 25-35% with mid/apical hypokinesis/akinesis). Mildly elevated left ventricular filling pressure (LVEDP  18 mmHg, 25 mmHg post a-wave). Successful PCI to proximal LAD using Onyx Frontier 2.75 x 18 mm drug-eluting stent (postdilated to 3.3 mm) with 0% residual stenosis and TIMI-3 flow.   01/10/2024 Echo complete 1. Left ventricular ejection fraction, by estimation, is 35 to 40%. The  left ventricle has moderately decreased function. The left ventricle  demonstrates regional wall motion abnormalities (see scoring  diagram/findings for description). There is mild  asymmetric left ventricular hypertrophy of the basal-septal segment. Left  ventricular diastolic parameters are consistent with Grade II diastolic  dysfunction (pseudonormalization).   2. Right ventricular systolic function is low normal. The right  ventricular size is normal. There is normal pulmonary artery systolic  pressure.   3. The mitral valve is normal in structure. Mild mitral valve  regurgitation. No evidence of mitral stenosis.   4. The aortic valve is tricuspid. Aortic valve regurgitation is not  visualized. No aortic stenosis is present.   5. The inferior vena cava is normal in size with greater than 50%  respiratory variability, suggesting right atrial pressure of 3 mmHg.   Patient Profile     Leslie Shepard is a 63 y.o. female with a hx of with history of hyperlipidemia, who is being seen for the evaluation of STEMI.   Assessment & Plan    Anterior STEMI Ventricular tachycardia/fibrillation Torsades de pointe - Presented 2/25 with progressive chest pain over several months, progressive ST elevation on EKG with elevated troponin, taken for emergent cardiac catheterization - LHC showed thrombotic occlusion of the proximal LAD, treated with primary PCI and DES placement - Patient did have VF and torsades requiring CPR and defibrillation x2 with IV lidocaine and amiodarone started on 2/25-2/26 - Suspect ongoing chest discomfort is musculoskeletal in nature secondary to CPR and defibrillation - Telemetry without  further arrhythmia yesterday and today, remains in sinus rhythm with occasional PVC - EP following and recommend transition from IV lidocaine to oral mexiletine today - Continue IV amiodarone and oral metoprolol succinate 12.5 mg daily - EP will  also arrange life vest prior to discharge - Recommend outpatient cardiac MRI - Continue DAPT with ASA and prasugrel - Recommend aggressive secondary prevention; long discussion with patient regarding this  Acute HFrEF - Echo with LVEF 35-40%, mild asymmetric LVH, GIIDD, low normal RV function, mild MR - Appears euvolemic on exam - Continue metoprolol succinate 12.5 mg - Spironolactone held in the setting of hyponatremia - Will continue to advance GDMT as BP and kidney function allow - AHF team following  Hyperlipidemia - LDL 155 - Patient has history of hyperlipidemia with previous lipid panel 09/2023 LDL 234, suggestive of familial heterozygous hyperlipidemia - Patient has been making drastic dietary changes in an effort to reduce LDL - Intolerant to statins, reports trying several including simvastatin, atorvastatin, and rosuvastatin; she is reluctant to retrial statin - Will start ezetimibe 10 mg and rosuvastatin 5 mg today with plan for potential addition of PCSK9 inhibitor on discharge, we are working with pharmacy on insurance approval  Hyponatremia - Na 124 today - Will hold spironolactone - Reduce free water intake and increase sodium in diet  For questions or updates, please contact [facility] Please consult [email or web address] for contact info under        Signed, Orion Crook, PA-C  01/12/2024, 8:13 AM

## 2024-01-12 NOTE — Assessment & Plan Note (Signed)
Replaced during the hospital course 

## 2024-01-12 NOTE — Progress Notes (Signed)
   01/12/24 1400  Spiritual Encounters  Type of Visit Follow up  Care provided to: Patient  Conversation partners present during encounter Nurse  Referral source Chaplain assessment  Reason for visit Routine spiritual support  OnCall Visit No  Spiritual Framework  Presenting Themes Courage hope and growth;Meaning/purpose/sources of inspiration  Interventions  Spiritual Care Interventions Made Compassionate presence;Reflective listening;Encouragement  Intervention Outcomes  Outcomes Reduced anxiety;Reduced isolation  Spiritual Care Plan  Spiritual Care Issues Still Outstanding No further spiritual care needs at this time (see row info)

## 2024-01-12 NOTE — Progress Notes (Addendum)
 Progress Note   Patient: Leslie Shepard:[id number] DOB: 05/16/1961 DOA: 01/10/2024     2 DOS: the patient was seen and examined on 01/12/2024   Brief hospital course: 63 year old female with past medical history of hyperlipidemia intolerant of statins and was very active coming into the emergency room with ongoing chest pain.  Patient was found to have a STEMI and brought to the cardiac Cath Lab and had a stent in the LAD.  EF on cath 25 to 30%  Overnight CODE BLUE was called secondary to ventricular fibrillation.  Patient was shocked and then became responsive.  Patient had 2 more episodes of potentially torsades on the monitor and shocked another time.  Received magnesium.  Patient was started on lidocaine drip and amiodarone.  Echocardiogram shows an EF of 35 to 40%.  2/26.  Patient feeling better on amiodarone and amiodarone drip.  Seen by EP specialist and cardiology.  Replace potassium. 2/27.  Lidocaine drip discontinued today.  Started on mexiletine.  Still on amiodarone drip.  Will need a LifeVest.  Replacing IV magnesium today.  Assessment and Plan: * STEMI (ST elevation myocardial infarction) (HCC) Status post stent in the LAD.  Patient on Effient, aspirin, Zetia, Toprol-XL and low-dose Crestor.  LDL 155.  Cardiac arrest (HCC) Secondary to ventricular fibrillation.  Patient on amiodarone drip.  Lidocaine drip discontinued today and started on mexiletine.  Torsades de pointes (HCC) Received magnesium.  On antiarrhythmics.  Replace magnesium again today  Ischemic cardiomyopathy EF on echocardiogram 35 to 40%.  May end up needing a LifeVest prior to disposition.  Patient on low-dose Toprol-XL  Hypokalemia Replaced  Acute metabolic acidosis On patient with STEMI.  Hyponatremia Sodium down to 124.  Could be secondary to the drips that she is on.  Will be a less restrictive with the salt in the diet.  Fluid restrict.  Check urine osmolarity and urine  sodium.  Hypomagnesemia Replace IV magnesium again today.  AKI (acute kidney injury) (HCC) Creatinine as high as 1.09 on 2/26.  Today's creatinine 0.68.  Elevated liver function tests Likely secondary to STEMI.  AST down to 159 and ALT down to 105.  Hyperlipidemia, unspecified LDL 155.  Patient intolerant to statins but started on low-dose Crestor.        Subjective: Patient feels okay.  Offers no complaints.  Admitted with STEMI and then had ventricular tachycardia.  Physical Exam: Vitals:   01/12/24 1000 01/12/24 1100 01/12/24 1200 01/12/24 1300  BP: 110/64 110/63 99/64 98/61   Pulse: 74 75 67 60  Resp: (!) 25 17 19 18   Temp:      TempSrc:      SpO2: 96% 96% 96% 95%  Weight:      Height:       Physical Exam HENT:     Head: Normocephalic.     Mouth/Throat:     Pharynx: No oropharyngeal exudate.  Eyes:     General: Lids are normal.     Conjunctiva/sclera: Conjunctivae normal.  Cardiovascular:     Rate and Rhythm: Normal rate and regular rhythm.     Heart sounds: Normal heart sounds, S1 normal and S2 normal.  Pulmonary:     Breath sounds: No decreased breath sounds, wheezing, rhonchi or rales.  Abdominal:     Palpations: Abdomen is soft.     Tenderness: There is no abdominal tenderness.  Musculoskeletal:     Right lower leg: No swelling.     Left lower leg: No swelling.  Skin:  General: Skin is warm.     Findings: No rash.  Neurological:     Mental Status: She is alert and oriented to person, place, and time.     Data Reviewed: Sodium 124, creatinine 0.68, magnesium 1.8, potassium 4.0, AST 159, ALT 105, white blood cell count 7.6, hemoglobin 11.9, platelet count 182  Family Communication: Husband yesterday  Disposition: Status is: Inpatient Remains inpatient appropriate because: Discontinue IV lidocaine drip and started on mexiletine, still on IV amiodarone drip.  Planned Discharge Destination: Home    Time spent: 28 minutes  Author: Alford Highland, MD 01/12/2024 2:02 PM  For on call review [email or web address].

## 2024-01-12 NOTE — Assessment & Plan Note (Signed)
 Sodium down to 124.  Could be secondary to the drips that she is on.  Will be a less restrictive with the salt in the diet.  Fluid restrict.  Check urine osmolarity and urine sodium.

## 2024-01-12 NOTE — Telephone Encounter (Signed)
 Pharmacy Patient Advocate Encounter  Received notification from [facility] that Prior Authorization for Repatha SureClick 140MG /ML auto-injectors  has been DENIED.  Full denial letter will be uploaded to the media tab. See denial reason below. Your plan only covers this drug for your health condition, primary hyperlipidemia, when your untreated (before any lipid-lowering therapy) low-density lipoprotein cholesterol (LDL-C) (bad cholesterol) level is sent to [place], and your test results are in a certain range (190 mg/dL or greater). Your records must be provided and must show what your doctor tells [place]. We denied your request because: A) We did not receive your results, or B) Your results were not in the approvable range. We reviewed the information we had. Your request has been denied. Your doctor can send [place] any new or missing information for [place] to review. For this drug, you may have to meet other criteria. You can request the drug policy for more details. You can also request other plan documents for your review.  PA #/Case ID/Reference #: [id number]

## 2024-01-12 NOTE — TOC Progression Note (Signed)
 Transition of Care [facility]) - Progression Note    Patient Details  Name: Leslie Shepard MRN: [id number] Date of Birth: January 07, 1961  [facility] CM/SW Contact  Garret Reddish, RN Phone Number: 01/12/2024, 3:00 PM  Clinical Narrative:    Chart reviewed.  I have been informed by Irving Burton with [employer] that she was informed by Provider Riddle that patient will need a [employer] Lifevest on discharge.  I have emailed Irving Burton Provider's progress note, Echo and Cath Results, DME order for the Lifevest, and Demographic sheet.  Emily's contact number is [phone]. Irving Burton will submit for insurance approval and schedule to fit patient on-site this afternoon or tomorrow once insurance has approved for the lifevest.      TOC will continue to follow for discharge planning.          Expected Discharge Plan and Services                                               Social Determinants of Health (SDOH) Interventions SDOH Screenings   Food Insecurity: No Food Insecurity (01/10/2024)  Housing: Low Risk  (01/10/2024)  Transportation Needs: No Transportation Needs (01/10/2024)  Utilities: Not At Risk (01/10/2024)  Alcohol Screen: Low Risk  (10/11/2023)  Depression (PHQ2-9): Low Risk  (10/11/2023)  Financial Resource Strain: Patient Declined (10/11/2023)  Physical Activity: Sufficiently Active (10/11/2023)  Social Connections: Unknown (10/11/2023)  Stress: No Stress Concern Present (10/11/2023)  Tobacco Use: Low Risk  (01/10/2024)    Readmission Risk Interventions     No data to display

## 2024-01-13 ENCOUNTER — Encounter: Payer: Self-pay | Admitting: Internal Medicine

## 2024-01-13 DIAGNOSIS — E871 Hypo-osmolality and hyponatremia: Secondary | ICD-10-CM

## 2024-01-13 DIAGNOSIS — I5021 Acute systolic (congestive) heart failure: Secondary | ICD-10-CM

## 2024-01-13 DIAGNOSIS — I4721 Torsades de pointes: Secondary | ICD-10-CM | POA: Diagnosis not present

## 2024-01-13 DIAGNOSIS — E8721 Acute metabolic acidosis: Secondary | ICD-10-CM

## 2024-01-13 DIAGNOSIS — I2102 ST elevation (STEMI) myocardial infarction involving left anterior descending coronary artery: Secondary | ICD-10-CM | POA: Diagnosis not present

## 2024-01-13 DIAGNOSIS — I472 Ventricular tachycardia, unspecified: Secondary | ICD-10-CM

## 2024-01-13 DIAGNOSIS — I469 Cardiac arrest, cause unspecified: Secondary | ICD-10-CM | POA: Diagnosis not present

## 2024-01-13 DIAGNOSIS — I255 Ischemic cardiomyopathy: Secondary | ICD-10-CM | POA: Diagnosis not present

## 2024-01-13 LAB — COMPREHENSIVE METABOLIC PANEL
ALT: 80 U/L — ABNORMAL HIGH (ref 0–44)
AST: 77 U/L — ABNORMAL HIGH (ref 15–41)
Albumin: 3.7 g/dL (ref 3.5–5.0)
Alkaline Phosphatase: 47 U/L (ref 38–126)
Anion gap: 9 (ref 5–15)
BUN: 17 mg/dL (ref 8–23)
CO2: 22 mmol/L (ref 22–32)
Calcium: 9.1 mg/dL (ref 8.9–10.3)
Chloride: 107 mmol/L (ref 98–111)
Creatinine, Ser: 0.67 mg/dL (ref 0.44–1.00)
GFR, Estimated: >60 mL/min (ref >60)
Glucose, Bld: 112 mg/dL — ABNORMAL HIGH (ref 70–99)
Potassium: 4.1 mmol/L (ref 3.5–5.1)
Sodium: 138 mmol/L (ref 135–145)
Total Bilirubin: 0.5 mg/dL (ref 0.0–1.2)
Total Protein: 6.2 g/dL — ABNORMAL LOW (ref 6.5–8.1)

## 2024-01-13 LAB — LIDOCAINE LEVEL: Lidocaine Lvl: 1.6 ug/mL (ref 1.5–5.0)

## 2024-01-13 LAB — MAGNESIUM: Magnesium: 2.3 mg/dL (ref 1.7–2.4)

## 2024-01-13 LAB — LIPOPROTEIN A (LPA): Lipoprotein (a): 105.6 nmol/L — ABNORMAL HIGH (ref <75.0)

## 2024-01-13 MED ORDER — POLYETHYLENE GLYCOL 3350 17 G PO PACK: 17.0000 g | PACK | Freq: Every day | ORAL | Status: DC

## 2024-01-13 MED ORDER — AMIODARONE HCL 200 MG PO TABS
200.0000 mg | ORAL_TABLET | Freq: Two times a day (BID) | ORAL | Status: DC
Start: 2024-01-27 — End: 2024-01-13

## 2024-01-13 MED ORDER — AMIODARONE HCL 200 MG PO TABS: 200.0000 mg | ORAL_TABLET | Freq: Every day | ORAL | Status: DC

## 2024-01-13 MED ORDER — SENNA 8.6 MG PO TABS
2.0000 | ORAL_TABLET | Freq: Every day | ORAL | Status: DC
  Administered 2024-01-14: 17.2 mg via ORAL
  Filled 2024-01-13: qty 2

## 2024-01-13 MED ORDER — AMIODARONE HCL 200 MG PO TABS
200.0000 mg | ORAL_TABLET | Freq: Two times a day (BID) | ORAL | Status: DC
  Administered 2024-01-13 – 2024-01-14 (×2): 200 mg via ORAL
  Filled 2024-01-13 (×2): qty 1

## 2024-01-13 MED ORDER — CHLORHEXIDINE GLUCONATE CLOTH 2 % EX PADS
6.0000 | MEDICATED_PAD | Freq: Every day | CUTANEOUS | Status: DC
Start: 2024-01-13 — End: 2024-01-14
  Administered 2024-01-13: 6 via TOPICAL

## 2024-01-13 MED ORDER — EMPAGLIFLOZIN 10 MG PO TABS
10.0000 mg | ORAL_TABLET | Freq: Every day | ORAL | Status: DC
  Administered 2024-01-13 – 2024-01-14 (×2): 10 mg via ORAL
  Filled 2024-01-13 (×2): qty 1

## 2024-01-13 MED ORDER — AMIODARONE HCL 200 MG PO TABS
200.0000 mg | ORAL_TABLET | Freq: Two times a day (BID) | ORAL | Status: DC
  Administered 2024-01-13: 200 mg via ORAL
  Filled 2024-01-13: qty 1

## 2024-01-13 NOTE — Progress Notes (Signed)
 EP brief progress note -   She feels well without complaints.    No further TDP, VF, or VT episodes on tele  Will transition iv amiodarone to PO - 200mg  BID x 14 days, then 200mg  daily Continue 150mg  mexiletine BID  Lifevest to be fitted today   Will plan to have cardiac MRI in about 1 month, and follow-up with Dr. Lalla Brothers after MRI.     Sherie Don, NP Electrophysiology 01/13/24 9:17 AM

## 2024-01-13 NOTE — Plan of Care (Signed)

## 2024-01-13 NOTE — Progress Notes (Signed)
 Progress Note   Patient: Leslie Shepard NWG:[id number] DOB: [birth year] DOA: 01/10/2024     3 DOS: the patient was seen and examined on 01/13/2024   Brief hospital course: 63 year old female with past medical history of hyperlipidemia intolerant of statins and was very active coming into the emergency room with ongoing chest pain.  Patient was found to have a STEMI and brought to the cardiac Cath Lab and had a stent in the LAD.  EF on cath 25 to 30%  Overnight CODE BLUE was called secondary to ventricular fibrillation.  Patient was shocked and then became responsive.  Patient had 2 more episodes of potentially torsades on the monitor and shocked another time.  Received magnesium.  Patient was started on lidocaine drip and amiodarone.  Echocardiogram shows an EF of 35 to 40%.  2/26.  Patient feeling better on amiodarone and amiodarone drip.  Seen by EP specialist and cardiology.  Replace potassium. 2/27.  Lidocaine drip discontinued today.  Started on mexiletine.  Still on amiodarone drip.  Will need a LifeVest.  Replacing IV magnesium today. 2/28.  Amiodarone drip discontinued today.  LifeVest fitting for today  Assessment and Plan: * STEMI (ST elevation myocardial infarction) (HCC) Status post stent in the LAD.  Patient on Effient, aspirin, Zetia, Toprol-XL and low-dose Crestor (intolerant to higher doses).  LDL 155.  Can consider PCSK9 inhibitor as outpatient.  Cardiac arrest (HCC) Secondary to ventricular fibrillation, torsades. Lidocaine drip discontinued 2/27 and started on mexiletine.  Amiodarone drip switched to oral on 2/28.  Torsades de pointes (HCC) Received magnesium.  On antiarrhythmics.    Ischemic cardiomyopathy EF on echocardiogram 35 to 40%.  LifeVest fitting today.  Patient on low-dose Toprol-XL.  Currently blood pressure too low for other medications.  Hypokalemia Replaced  Acute metabolic acidosis On presentation with STEMI.  Hyponatremia Sodium down to 124 on  2/27.  Likely from numerous strips containing D5W.  Today sodium 138 with fluid restriction and regular diet  Hypomagnesemia Replaced during the hospital course  AKI (acute kidney injury) (HCC) Creatinine as high as 1.09 on 2/26.  Today's creatinine 0.67.  Elevated liver function tests Likely secondary to STEMI.  AST down to 77 and ALT down to 80.  Hyperlipidemia, unspecified LDL 155.  Patient intolerant to statins but started on low-dose Crestor.  Consider PCSK9 inhibitor as outpatient.        Subjective: Patient feeling well.  Cardiology converting off amiodarone drip today.  Potential disposition home tomorrow.  Physical Exam: Vitals:   01/13/24 1400 01/13/24 1500 01/13/24 1600 01/13/24 1700  BP: 130/66 124/77 119/71 118/71  Pulse: 64 62 62 (!) 57  Resp: (!) 21 20 (!) 22 15  Temp:   97.6 F (36.4 C)   TempSrc:   Axillary   SpO2: 99% 100% 100% 95%  Weight:      Height:       Physical Exam HENT:     Head: Normocephalic.     Mouth/Throat:     Pharynx: No oropharyngeal exudate.  Eyes:     General: Lids are normal.     Conjunctiva/sclera: Conjunctivae normal.  Cardiovascular:     Rate and Rhythm: Normal rate and regular rhythm.     Heart sounds: Normal heart sounds, S1 normal and S2 normal.  Pulmonary:     Breath sounds: No decreased breath sounds, wheezing, rhonchi or rales.  Abdominal:     Palpations: Abdomen is soft.     Tenderness: There is no abdominal tenderness.  Musculoskeletal:     Right lower leg: No swelling.     Left lower leg: No swelling.  Skin:    General: Skin is warm.     Findings: No rash.  Neurological:     Mental Status: She is alert and oriented to person, place, and time.     Data Reviewed: Creatinine 0.  6 7, AST 77, ALT 80, sodium 138  Disposition: Status is: Inpatient Remains inpatient appropriate because: Patient being taken off amiodarone drip today and placed on oral amiodarone.  LifeVest fitting today.  Planned Discharge  Destination: Home    Time spent: 28 minutes  Author: Alford Highland, MD 01/13/2024 5:19 PM  For on call review [email or web address].

## 2024-01-13 NOTE — Progress Notes (Addendum)
 Rounding Note    Patient Name: Leslie Shepard Date of Encounter: 01/13/2024  [facility] Cardiologist: Verne Carrow, MD   Subjective   Patient seen on a.m. rounds.  Denies any chest pain or shortness of breath.  She was able to be up out of bed and ambulate yesterday.  No further torsades/V. tach/V-fib admitted on telemetry overnight.  Transition off of lidocaine infusion yesterday to mexiletine will transition off of IV amiodarone to oral amiodarone today.  She is to be fitted for LifeVest later today with pending discharge potentially tomorrow.  Inpatient Medications    Scheduled Meds:  aspirin  81 mg Oral Daily   Chlorhexidine Gluconate Cloth  6 each Topical Daily   enoxaparin (LOVENOX) injection  40 mg Subcutaneous Q24H   ezetimibe  10 mg Oral Daily   metoprolol succinate  12.5 mg Oral Daily   mexiletine  150 mg Oral Q12H   prasugrel  5 mg Oral Daily   rosuvastatin  5 mg Oral Daily   sodium chloride flush  3 mL Intravenous Q12H   Continuous Infusions:  amiodarone 30 mg/hr (01/13/24 0637)   PRN Meds: acetaminophen **OR** acetaminophen, morphine injection, ondansetron **OR** ondansetron (ZOFRAN) IV, mouth rinse, oxyCODONE, polyethylene glycol, sodium chloride flush, traZODone   Vital Signs    Vitals:   01/13/24 0500 01/13/24 0542 01/13/24 0600 01/13/24 0700  BP:  (!) 95/57 98/63   Pulse: (!) 52 (!) 56 (!) 57 (!) 54  Resp: 18 16 12 15   Temp:      TempSrc:      SpO2: 97% 97% 97% 95%  Weight:      Height:        Intake/Output Summary (Last 24 hours) at 01/13/2024 0743 Last data filed at 01/13/2024 0400 Gross per 24 hour  Intake 1875.03 ml  Output 2125 ml  Net -249.97 ml      01/10/2024    1:10 PM 10/17/2023    1:21 PM 10/11/2023   11:28 AM  Last 3 Weights  Weight (lbs) 123 lb 144 lb 146 lb 4 oz  Weight (kg) 55.792 kg 65.318 kg 66.339 kg      Telemetry    Sinus bradycardia sinus rhythm rates of 50-60- Personally Reviewed  ECG    No  new tracings- Personally Reviewed  Physical Exam   GEN: No acute distress.   Neck: No JVD Cardiac: RRR, no murmurs, rubs, or gallops.  Respiratory: Clear to auscultation bilaterally. GI: Soft, nontender, non-distended  MS: No edema; No deformity. Neuro:  Nonfocal  Psych: Normal affect   Labs    High Sensitivity Troponin:   Recent Labs  Lab 01/10/24 1311 01/10/24 1524 01/10/24 1840  TROPONINIHS 64* 310* >24,000*     Chemistry Recent Labs  Lab 01/11/24 0001 01/11/24 0351 01/11/24 1957 01/12/24 0412 01/13/24 0320  NA 132*   < > 129* 124* 138  K 3.2*   < > 4.1 4.0 4.1  CL 101   < > 98 95* 107  CO2 18*   < > 22 21* 22  GLUCOSE 182*   < > 108* 122* 112*  BUN 15   < > 11 12 17   CREATININE 1.03*   < > 0.69 0.68 0.67  CALCIUM 9.3   < > 9.2 8.9 9.1  MG 2.2  --   --  1.8 2.3  PROT 6.2*  --   --  5.8* 6.2*  ALBUMIN 3.9   < > 3.7 3.4* 3.7  AST 504*  --   --  159* 77*  ALT 173*  --   --  105* 80*  ALKPHOS 50  --   --  40 47  BILITOT 1.1  --   --  0.5 0.5  GFRNONAA >60   < > >60 >60 >60  ANIONGAP 13   < > 9 8 9    < > = values in this interval not displayed.    Lipids  Recent Labs  Lab 01/11/24 0351  CHOL 229*  TRIG 68  HDL 60  LDLCALC 155*  CHOLHDL 3.8    Hematology Recent Labs  Lab 01/11/24 0001 01/11/24 0351 01/12/24 0412  WBC 9.3 8.8 7.6  RBC 4.23 3.96 3.85*  HGB 13.0 12.1 11.9*  HCT 37.5 34.7* 33.9*  MCV 88.7 87.6 88.1  MCH 30.7 30.6 30.9  MCHC 34.7 34.9 35.1  RDW 13.2 13.2 13.3  PLT 215 192 182   Thyroid No results for input(s): "TSH", "FREET4" in the last 168 hours.  BNPNo results for input(s): "BNP", "PROBNP" in the last 168 hours.  DDimer No results for input(s): "DDIMER" in the last 168 hours.   Radiology    DG Chest Port 1 View Result Date: 01/12/2024 CLINICAL DATA:  Chest pain EXAM: PORTABLE CHEST 1 VIEW COMPARISON:  01/10/2024 FINDINGS: The heart size and mediastinal contours are within normal limits. Both lungs are clear. The visualized  skeletal structures are unremarkable. IMPRESSION: No active disease. Electronically Signed   By: Duanne Guess D.O.   On: 01/12/2024 12:50    Cardiac Studies   01/10/2024 LHC Severe single-vessel coronary artery disease with thrombotic occlusion of the proximal LAD.  There is also mild, nonobstructive disease involving the proximal LCx and mid RCA. Myocardial bridging of the distal LAD noted. Severely reduced left ventricular systolic function (LVEF 25-35% with mid/apical hypokinesis/akinesis). Mildly elevated left ventricular filling pressure (LVEDP 18 mmHg, 25 mmHg post a-wave). Successful PCI to proximal LAD using Onyx Frontier 2.75 x 18 mm drug-eluting stent (postdilated to 3.3 mm) with 0% residual stenosis and TIMI-3 flow.   01/10/2024 Echo complete 1. Left ventricular ejection fraction, by estimation, is 35 to 40%. The  left ventricle has moderately decreased function. The left ventricle  demonstrates regional wall motion abnormalities (see scoring  diagram/findings for description). There is mild  asymmetric left ventricular hypertrophy of the basal-septal segment. Left  ventricular diastolic parameters are consistent with Grade II diastolic  dysfunction (pseudonormalization).   2. Right ventricular systolic function is low normal. The right  ventricular size is normal. There is normal pulmonary artery systolic  pressure.   3. The mitral valve is normal in structure. Mild mitral valve  regurgitation. No evidence of mitral stenosis.   4. The aortic valve is tricuspid. Aortic valve regurgitation is not  visualized. No aortic stenosis is present.   5. The inferior vena cava is normal in size with greater than 50%  respiratory variability, suggesting right atrial pressure of 3 mmHg.   Patient Profile     63 y.o. female with a history of hyperlipidemia for the evaluation of anterior STEMI.  Assessment & Plan    Anterior STEMI/ventricular tachycardia/fibrillation/torsades de  pointe -Presented 2/25 with progressive chest pain over several months with progressive ST elevation on EKG and elevated troponin taken for emergent cardiac catheterization -Left heart catheterization revealed thrombotic occlusion of the proximal LAD treated with primary PCI/DES -Patient had V-fib and torsades requiring CPR and defibrillation x 2 with IV lidocaine and amiodarone started on 2/25/2/26 -Continues with musculoskeletal discomfort likely related to  CPR -Telemetry without further arrhythmia overnight -EP transition from IV lidocaine to oral mexiletine on 01/12/2024 -Plan to transition IV amiodarone to oral amiodarone today -Continued on metoprolol succinate 12.5 mg daily -EP arranging for LifeVest measurements prior to discharge, plan for fitting today -Recommend outpatient cardiac MRI -Continue on DAPT with aspirin 81 mg and prasugrel 10 mg for minimum of 12 months -Cardiac rehab referral on discharge -Recommend continuing aggressive secondary prevention lungs discussion with patient regarding dietary and activity changes -Encourage increased activity and ambulation in room around unit -Continued on telemetry monitoring  HFrEF s/p STEMI -Echocardiogram with an LVEF of 35 to 40%, mild asymmetric LVH, G2 DD, low normal RV function, mild MR -Denies any shortness of breath maintaining oxygen saturations on room air -Appears to be euvolemic on exam -Continued on metoprolol succinate 12.5 mg daily -Will continue to escalate GDMT as tolerated by blood pressure and kidney function -Advanced heart failure is following as well -Heart failure education -Daily weights, I's and O's  Hyperlipidemia -LDL 155 -Previous lipid panel 09/2023 with an 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  Several statins including simvastatin, atorvastatin, and rosuvastatin but she is reluctant to retrial statin therapy at this time -Continued on  ezetimibe 10 mg daily and rosuvastatin 5 mg daily with a plan for potential addition of PCSK9 inhibitor on discharge.  Continuing to work with pharmacy on insurance approval  Hyponatremia - resolved -likely secondary to Lidocaine infusion -Serum sodium today 138 -Spironolactone remains on hold, consider restarting as outpatient -Daily BMP     For questions or updates, please contact [facility] Please consult [email or web address] for contact info under        Signed, Shameeka Silliman, NP  01/13/2024, 7:43 AM

## 2024-01-14 DIAGNOSIS — I255 Ischemic cardiomyopathy: Secondary | ICD-10-CM | POA: Diagnosis not present

## 2024-01-14 DIAGNOSIS — I4721 Torsades de pointes: Secondary | ICD-10-CM | POA: Diagnosis not present

## 2024-01-14 DIAGNOSIS — I469 Cardiac arrest, cause unspecified: Secondary | ICD-10-CM | POA: Diagnosis not present

## 2024-01-14 DIAGNOSIS — I2119 ST elevation (STEMI) myocardial infarction involving other coronary artery of inferior wall: Secondary | ICD-10-CM | POA: Diagnosis not present

## 2024-01-14 DIAGNOSIS — I472 Ventricular tachycardia, unspecified: Secondary | ICD-10-CM

## 2024-01-14 LAB — BASIC METABOLIC PANEL
Anion gap: 9 (ref 5–15)
BUN: 13 mg/dL (ref 8–23)
CO2: 22 mmol/L (ref 22–32)
Calcium: 10 mg/dL (ref 8.9–10.3)
Chloride: 105 mmol/L (ref 98–111)
Creatinine, Ser: 0.79 mg/dL (ref 0.44–1.00)
GFR, Estimated: >60 mL/min (ref >60)
Glucose, Bld: 99 mg/dL (ref 70–99)
Potassium: 4.1 mmol/L (ref 3.5–5.1)
Sodium: 136 mmol/L (ref 135–145)

## 2024-01-14 MED ORDER — POLYETHYLENE GLYCOL 3350 17 G PO PACK
17.0000 g | PACK | Freq: Every day | ORAL | 0 refills | Status: DC
Start: 2024-01-14 — End: 2024-03-07

## 2024-01-14 MED ORDER — OXYCODONE HCL 5 MG PO TABS: 5.0000 mg | ORAL_TABLET | Freq: Four times a day (QID) | ORAL | 0 refills | Status: DC | PRN

## 2024-01-14 MED ORDER — EZETIMIBE 10 MG PO TABS: 10.0000 mg | ORAL_TABLET | Freq: Every day | ORAL | 1 refills | Status: DC

## 2024-01-14 MED ORDER — METOPROLOL SUCCINATE ER 25 MG PO TB24: 12.5000 mg | ORAL_TABLET | Freq: Every day | ORAL | 2 refills | Status: DC

## 2024-01-14 MED ORDER — ASPIRIN 81 MG PO CHEW: 81.0000 mg | CHEWABLE_TABLET | Freq: Every day | ORAL | 1 refills | Status: AC

## 2024-01-14 MED ORDER — PRASUGREL HCL 5 MG PO TABS: 5.0000 mg | ORAL_TABLET | Freq: Every day | ORAL | 2 refills | Status: DC

## 2024-01-14 MED ORDER — AMIODARONE HCL 200 MG PO TABS: ORAL_TABLET | ORAL | 0 refills | Status: DC

## 2024-01-14 MED ORDER — EMPAGLIFLOZIN 10 MG PO TABS: 10.0000 mg | ORAL_TABLET | Freq: Every day | ORAL | 2 refills | Status: DC

## 2024-01-14 MED ORDER — MEXILETINE HCL 150 MG PO CAPS: 150.0000 mg | ORAL_CAPSULE | Freq: Two times a day (BID) | ORAL | 2 refills | Status: DC

## 2024-01-14 NOTE — Progress Notes (Signed)
 Rounding Note    Patient Name: Leslie Shepard Date of Encounter: 01/14/2024  [facility] Cardiologist: Verne Carrow, MD    Patient Profile     63 y.o. female with a history of hyperlipidemia for the evaluation of anterior STEMI. LAD-T>>stent Post procedure>VF Not TdP, < 6 hrs.  Recommendation is amio for now with cMRI followup and ICD per residual EF.    LifeVEst ordered   Subjective   .without complaint  Inpatient Medications    Scheduled Meds:  amiodarone  200 mg Oral BID   Followed by   Melene Muller ON 01/27/2024] amiodarone  200 mg Oral Daily   aspirin  81 mg Oral Daily   Chlorhexidine Gluconate Cloth  6 each Topical Daily   empagliflozin  10 mg Oral Daily   enoxaparin (LOVENOX) injection  40 mg Subcutaneous Q24H   ezetimibe  10 mg Oral Daily   metoprolol succinate  12.5 mg Oral Daily   mexiletine  150 mg Oral Q12H   polyethylene glycol  17 g Oral Daily   prasugrel  5 mg Oral Daily   rosuvastatin  5 mg Oral Daily   senna  2 tablet Oral Daily   sodium chloride flush  3 mL Intravenous Q12H   Continuous Infusions:   PRN Meds: acetaminophen **OR** acetaminophen, morphine injection, mouth rinse, oxyCODONE, sodium chloride flush, traZODone   Vital Signs    Vitals:   01/14/24 0500 01/14/24 0600 01/14/24 0700 01/14/24 0800  BP: 104/62 106/60 121/68 129/71  Pulse: (!) 49 (!) 50 (!) 59 63  Resp: 13 13 12 15   Temp:      TempSrc:      SpO2: 93% 95% 96% 95%  Weight:      Height:        Intake/Output Summary (Last 24 hours) at 01/14/2024 0919 Last data filed at 01/13/2024 1600 Gross per 24 hour  Intake 1326.81 ml  Output --  Net 1326.81 ml      01/10/2024    1:10 PM 10/17/2023    1:21 PM 10/11/2023   11:28 AM  Last 3 Weights  Weight (lbs) 123 lb 144 lb 146 lb 4 oz  Weight (kg) 55.792 kg 65.318 kg 66.339 kg      Telemetry    Sinus  VT NS   ECG    No new tracings- Personally Reviewed  Physical Exam   Well developed and nourished in  no acute distress HENT normal Neck supple with JVP-  flat   Clear Regular rate and rhythm, no murmurs or gallops Abd-soft with active BS No Clubbing cyanosis edema Skin-warm and dry A & Oriented  Grossly normal sensory and motor function   Labs    High Sensitivity Troponin:   Recent Labs  Lab 01/10/24 1311 01/10/24 1524 01/10/24 1840  TROPONINIHS 64* 310* >24,000*     Chemistry Recent Labs  Lab 01/11/24 0001 01/11/24 0351 01/11/24 1957 01/12/24 0412 01/13/24 0320  NA 132*   < > 129* 124* 138  K 3.2*   < > 4.1 4.0 4.1  CL 101   < > 98 95* 107  CO2 18*   < > 22 21* 22  GLUCOSE 182*   < > 108* 122* 112*  BUN 15   < > 11 12 17   CREATININE 1.03*   < > 0.69 0.68 0.67  CALCIUM 9.3   < > 9.2 8.9 9.1  MG 2.2  --   --  1.8 2.3  PROT 6.2*  --   --  5.8* 6.2*  ALBUMIN 3.9   < > 3.7 3.4* 3.7  AST 504*  --   --  159* 77*  ALT 173*  --   --  105* 80*  ALKPHOS 50  --   --  40 47  BILITOT 1.1  --   --  0.5 0.5  GFRNONAA >60   < > >60 >60 >60  ANIONGAP 13   < > 9 8 9    < > = values in this interval not displayed.    Lipids  Recent Labs  Lab 01/11/24 0351  CHOL 229*  TRIG 68  HDL 60  LDLCALC 155*  CHOLHDL 3.8    Hematology Recent Labs  Lab 01/11/24 0001 01/11/24 0351 01/12/24 0412  WBC 9.3 8.8 7.6  RBC 4.23 3.96 3.85*  HGB 13.0 12.1 11.9*  HCT 37.5 34.7* 33.9*  MCV 88.7 87.6 88.1  MCH 30.7 30.6 30.9  MCHC 34.7 34.9 35.1  RDW 13.2 13.2 13.3  PLT 215 192 182   Thyroid No results for input(s): "TSH", "FREET4" in the last 168 hours.  BNPNo results for input(s): "BNP", "PROBNP" in the last 168 hours.  DDimer No results for input(s): "DDIMER" in the last 168 hours.   Radiology    No results found.   Cardiac Studies   01/10/2024 LHC Severe single-vessel coronary artery disease with thrombotic occlusion of the proximal LAD.  There is also mild, nonobstructive disease involving the proximal LCx and mid RCA. Myocardial bridging of the distal LAD  noted. Severely reduced left ventricular systolic function (LVEF 25-35% with mid/apical hypokinesis/akinesis). Mildly elevated left ventricular filling pressure (LVEDP 18 mmHg, 25 mmHg post a-wave). Successful PCI to proximal LAD using Onyx Frontier 2.75 x 18 mm drug-eluting stent (postdilated to 3.3 mm) with 0% residual stenosis and TIMI-3 flow.   01/10/2024 Echo complete 1. Left ventricular ejection fraction, by estimation, is 35 to 40%. The  left ventricle has moderately decreased function. The left ventricle  demonstrates regional wall motion abnormalities (see scoring  diagram/findings for description). There is mild  asymmetric left ventricular hypertrophy of the basal-septal segment. Left  ventricular diastolic parameters are consistent with Grade II diastolic  dysfunction (pseudonormalization).   2. Right ventricular systolic function is low normal. The right  ventricular size is normal. There is normal pulmonary artery systolic  pressure.   3. The mitral valve is normal in structure. Mild mitral valve  regurgitation. No evidence of mitral stenosis.   4. The aortic valve is tricuspid. Aortic valve regurgitation is not  visualized. No aortic stenosis is present.   5. The inferior vena cava is normal in size with greater than 50%  respiratory variability, suggesting right atrial pressure of 3 mmHg.   Assessment & Plan    Anterior STEMI/ventricular tachycardia/fibrillation/torsades de pointe -Presented 2/25 with progressive chest pain over several months with progressive ST elevation on EKG and elevated troponin taken for emergent cardiac catheterization -Left heart catheterization revealed thrombotic occlusion of the proximal LAD treated with primary PCI/DES -Patient had V-fib and torsades requiring CPR and defibrillation x 2 with IV lidocaine and amiodarone started on 2/25/2/26 -Continues with musculoskeletal discomfort likely related to CPR -Telemetry without further arrhythmia  overnight -EP transition from IV lidocaine to oral mexiletine on 01/12/2024 -Plan to transition IV amiodarone to oral amiodarone today -Continued on metoprolol succinate 12.5 mg daily -EP arranging for LifeVest measurements prior to discharge, plan for fitting today -Recommend outpatient cardiac MRI -Continue on DAPT with aspirin 81 mg and prasugrel 10  mg for minimum of 12 months -Cardiac rehab referral on discharge -Recommend continuing aggressive secondary prevention lungs discussion with patient regarding dietary and activity changes -Encourage increased activity and ambulation in room around unit -Continued on telemetry monitoring  HFrEF s/p STEMI -Echocardiogram with an LVEF of 35 to 40%, mild asymmetric LVH, G2 DD, low normal RV function, mild MR -Denies any shortness of breath maintaining oxygen saturations on room air -Appears to be euvolemic on exam -Continued on metoprolol succinate 12.5 mg daily -Will continue to escalate GDMT as tolerated by blood pressure and kidney function -Advanced heart failure is following as well -Heart failure education -Daily weights, I's and O's  Hyperlipidemia -LDL 155 -Previous lipid panel 09/2023 with an 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  Several statins including simvastatin, atorvastatin, and rosuvastatin but she is reluctant to retrial statin therapy at this time -Continued on ezetimibe 10 mg daily and rosuvastatin 5 mg daily with a plan for potential addition of PCSK9 inhibitor on discharge.  Continuing to work with pharmacy on insurance approval  Hyponatremia - resolved -likely secondary to Lidocaine infusion -Serum sodium today 138 -Spironolactone remains on hold, consider restarting as outpatient -Daily BMP     For questions or updates, please contact [facility] Please consult [email or web address] for contact info under     Anterior Wall MI  LAD prox total  >> stent VT-PM ( not TdP)  Brady sinus HFrEF Hyperlipidemia Hyponatremia  For discharge with LIfe vest Will plan short term amio Hyponatremia has been attributed to lidocaine, but google scholar search demonstates only one paper and this in infants.  I think we need to be suspect about this mechanistic association.       Signed, Sherryl Manges, MD  01/14/2024, 9:19 AM

## 2024-01-14 NOTE — Discharge Summary (Signed)
 Physician Discharge Summary   Patient: Leslie Shepard MRN: [id number] DOB: 15-Feb-1961  Admit date:     01/10/2024  Discharge date: 01/14/24  Discharge Physician: Arnetha Courser   PCP: Emily Filbert, MD   Recommendations at discharge:  Please obtain CBC and BMP on follow-up Follow-up with cardiology Follow-up with cardiac rehab Follow-up with primary care provider  Discharge Diagnoses: Principal Problem:   STEMI (ST elevation myocardial infarction) [facility]) Active Problems:   Cardiac arrest (HCC)   Torsades de pointes (HCC)   Ischemic cardiomyopathy   Hypokalemia   Hyperlipidemia, unspecified   Elevated liver function tests   AKI (acute kidney injury) (HCC)   Chest pain due to myocardial ischemia   Hypomagnesemia   Hyponatremia   Acute metabolic acidosis   Acute HFrEF (heart failure with reduced ejection fraction) (HCC)   Ventricular tachycardia [facility])   Hospital Course: 63 year old female with past medical history of hyperlipidemia intolerant of statins and was very active coming into the emergency room with ongoing chest pain.  Patient was found to have a STEMI and brought to the cardiac Cath Lab and had a stent in the LAD.  EF on cath 25 to 30%  Overnight CODE BLUE was called secondary to ventricular fibrillation.  Patient was shocked and then became responsive.  Patient had 2 more episodes of potentially torsades on the monitor and shocked another time.  Received magnesium.  Patient was started on lidocaine drip and amiodarone.  Echocardiogram shows an EF of 35 to 40%.  2/26.  Patient feeling better on amiodarone and amiodarone drip.  Seen by EP specialist and cardiology.  Replace potassium. 2/27.  Lidocaine drip discontinued today.  Started on mexiletine.  Still on amiodarone drip.  Will need a LifeVest.  Replacing IV magnesium today. 2/28.  Amiodarone drip discontinued today.  LifeVest fitting for today  3/1: Patient remained hemodynamically stable.  Started on p.o.  amiodarone and mexiletine.  No cardiac chest pain while walking around but patient does have musculoskeletal chest pain after getting CPR.  No shortness of breath.  She was provided with LifeVest.  She will continue low-dose rosuvastatin with the plan to transition to PCSK9 inhibitor as outpatient due to being intolerant to high intensity statin.  Cardiology cleared her for discharge and they will arrange close follow-up.  Patient will continue on current medications and need to have a close follow-up with cardiology for further recommendations.    Assessment and Plan: * STEMI (ST elevation myocardial infarction) (HCC) Status post stent in the LAD.  Patient on Effient, aspirin, Zetia, Toprol-XL and low-dose Crestor (intolerant to higher doses).  LDL 155.  Can consider PCSK9 inhibitor as outpatient.  Cardiac arrest (HCC) Secondary to ventricular fibrillation, torsades. Lidocaine drip discontinued 2/27 and started on mexiletine.  Amiodarone drip switched to oral on 2/28.  Torsades de pointes (HCC) Received magnesium.  On antiarrhythmics.    Ischemic cardiomyopathy EF on echocardiogram 35 to 40%.  LifeVest fitting today.  Patient on low-dose Toprol-XL.  Currently blood pressure too low for other medications.  Hypokalemia Replaced  Acute metabolic acidosis On presentation with STEMI.  Hyponatremia Sodium down to 124 on 2/27.  Likely from numerous strips containing D5W.  Today sodium 138 with fluid restriction and regular diet  Hypomagnesemia Replaced during the hospital course  AKI (acute kidney injury) (HCC) Creatinine as high as 1.09 on 2/26.  Today's creatinine 0.79.  Elevated liver function tests Likely secondary to STEMI.  AST down to 77 and ALT down to 80.  Hyperlipidemia, unspecified LDL 155.  Patient intolerant to statins but started on low-dose Crestor.  Consider PCSK9 inhibitor as outpatient.   Pain control - [employer] Controlled Substance Reporting System  database was reviewed. and patient was instructed, not to drive, operate heavy machinery, perform activities at heights, swimming or participation in water activities or provide baby-sitting services while on Pain, Sleep and Anxiety Medications; until their outpatient Physician has advised to do so again. Also recommended to not to take more than prescribed Pain, Sleep and Anxiety Medications.  Consultants: Cardiology. Procedures performed: Cardiac cath and PCI Disposition: Home Diet recommendation:  Discharge Diet Orders (From admission, onward)     Start     Ordered   01/14/24 0000  Diet - low sodium heart healthy        01/14/24 1056           Cardiac diet DISCHARGE MEDICATION: Allergies as of 01/14/2024       Reactions   Statins    Malaise and muscle pain         Medication List     STOP taking these medications    metoprolol tartrate 100 MG tablet Commonly known as: LOPRESSOR       TAKE these medications    amiodarone 200 MG tablet Commonly known as: PACERONE Take 1 tablet (200 mg total) by mouth 2 (two) times daily for 12 days, THEN 1 tablet (200 mg total) daily. Start taking on: January 14, 2024   aspirin 81 MG chewable tablet Chew 1 tablet (81 mg total) by mouth daily. Start taking on: January 15, 2024   empagliflozin 10 MG Tabs tablet Commonly known as: JARDIANCE Take 1 tablet (10 mg total) by mouth daily. Start taking on: January 15, 2024   ezetimibe 10 MG tablet Commonly known as: ZETIA Take 1 tablet (10 mg total) by mouth daily. Start taking on: January 15, 2024   metoprolol succinate 25 MG 24 hr tablet Commonly known as: TOPROL-XL Take 0.5 tablets (12.5 mg total) by mouth daily. Start taking on: January 15, 2024   mexiletine 150 MG capsule Commonly known as: MEXITIL Take 1 capsule (150 mg total) by mouth every 12 (twelve) hours.   oxyCODONE 5 MG immediate release tablet Commonly known as: Oxy IR/ROXICODONE Take 1 tablet (5 mg total) by mouth every 6  (six) hours as needed for moderate pain (pain score 4-6).   polyethylene glycol 17 g packet Commonly known as: MIRALAX / GLYCOLAX Take 17 g by mouth daily.   prasugrel 5 MG Tabs tablet Commonly known as: EFFIENT Take 1 tablet (5 mg total) by mouth daily. Start taking on: January 15, 2024   rosuvastatin 10 MG tablet Commonly known as: Crestor Take 1 tablet (10 mg total) by mouth daily. In evening with a low fat snack               Durable Medical Equipment  (From admission, onward)           Start     Ordered   01/12/24 0832  For home use only DME Vest life vest  Once       Comments: Length of need 3 months   01/12/24 0831            Follow-up Information     Emily Filbert, MD. Schedule an appointment as soon as possible for a visit in 1 week(s).   Contact information: [address] [place] [address] [phone]  Discharge Exam: Filed Weights   01/10/24 1310  Weight: 55.8 kg   General.  Well-developed lady, in no acute distress. Pulmonary.  Lungs clear bilaterally, normal respiratory effort. CV.  Regular rate and rhythm, no JVD, rub or murmur. Abdomen.  Soft, nontender, nondistended, BS positive. CNS.  Alert and oriented .  No focal neurologic deficit. Extremities.  No edema, no cyanosis, pulses intact and symmetrical. Psychiatry.  Judgment and insight appears normal.   Condition at discharge: stable  The results of significant diagnostics from this hospitalization (including imaging, microbiology, ancillary and laboratory) are listed below for reference.   Imaging Studies: DG Chest Port 1 View Result Date: 01/12/2024 CLINICAL DATA:  Chest pain EXAM: PORTABLE CHEST 1 VIEW COMPARISON:  01/10/2024 FINDINGS: The heart size and mediastinal contours are within normal limits. Both lungs are clear. The visualized skeletal structures are unremarkable. IMPRESSION: No active disease. Electronically Signed   By:  Duanne Guess D.O.   On: 01/12/2024 12:50   ECHOCARDIOGRAM COMPLETE Result Date: 01/11/2024    ECHOCARDIOGRAM REPORT   Patient Name:   Leslie Shepard Date of Exam: 01/10/2024 Medical Rec #:  [id number]       Height:       64.0 in Accession #:    [id number]      Weight:       123.0 lb Date of Birth:  [birth year]       BSA:          1.591 m Patient Age:    63 years        BP:           110/68 mmHg Patient Gender: F               HR:           72 bpm. Exam Location:  [facility] Procedure: 2D Echo, Cardiac Doppler and Color Doppler (Both Spectral and Color            Flow Doppler were utilized during procedure). Indications:     I21.9 Myocardial Infarction  History:         Patient has no prior history of Echocardiogram examinations.                  Risk Factors:Dyslipidemia.  Sonographer:     Daphine Deutscher RDCS Referring Phys:  [id number] CHRISTOPHER END Diagnosing Phys: Yvonne Kendall MD IMPRESSIONS  1. Left ventricular ejection fraction, by estimation, is 35 to 40%. The left ventricle has moderately decreased function. The left ventricle demonstrates regional wall motion abnormalities (see scoring diagram/findings for description). There is mild asymmetric left ventricular hypertrophy of the basal-septal segment. Left ventricular diastolic parameters are consistent with Grade II diastolic dysfunction (pseudonormalization).  2. Right ventricular systolic function is low normal. The right ventricular size is normal. There is normal pulmonary artery systolic pressure.  3. The mitral valve is normal in structure. Mild mitral valve regurgitation. No evidence of mitral stenosis.  4. The aortic valve is tricuspid. Aortic valve regurgitation is not visualized. No aortic stenosis is present.  5. The inferior vena cava is normal in size with greater than 50% respiratory variability, suggesting right atrial pressure of 3 mmHg. FINDINGS  Left Ventricle: Left ventricular ejection fraction, by estimation, is 35 to 40%. The left  ventricle has moderately decreased function. The left ventricle demonstrates regional wall motion abnormalities. Strain imaging was not performed. The left ventricular internal cavity size was normal in size. There is mild asymmetric left ventricular hypertrophy of  the basal-septal segment. Left ventricular diastolic parameters are consistent with Grade II diastolic dysfunction (pseudonormalization).  LV Wall Scoring: The apical septal segment, apical anterior segment, and apex are akinetic. The mid anteroseptal segment, mid inferoseptal segment, and apical inferior segment are hypokinetic. The anterior wall, entire lateral wall, inferior wall, basal anteroseptal segment, and basal inferoseptal segment are normal. Right Ventricle: The right ventricular size is normal. No increase in right ventricular wall thickness. Right ventricular systolic function is low normal. There is normal pulmonary artery systolic pressure. The tricuspid regurgitant velocity is 2.33 m/s,  and with an assumed right atrial pressure of 3 mmHg, the estimated right ventricular systolic pressure is 24.7 mmHg. Left Atrium: Left atrial size was normal in size. Right Atrium: Right atrial size was normal in size. Pericardium: Trivial pericardial effusion is present. Mitral Valve: The mitral valve is normal in structure. Mild mitral valve regurgitation. No evidence of mitral valve stenosis. Tricuspid Valve: The tricuspid valve is normal in structure. Tricuspid valve regurgitation is mild. Aortic Valve: The aortic valve is tricuspid. Aortic valve regurgitation is not visualized. No aortic stenosis is present. Aortic valve mean gradient measures 2.8 mmHg. Aortic valve peak gradient measures 5.3 mmHg. Aortic valve area, by VTI measures 2.25 cm. Pulmonic Valve: The pulmonic valve was normal in structure. Pulmonic valve regurgitation is trivial. Aorta: The aortic root is normal in size and structure. Venous: The inferior vena cava is normal in size with  greater than 50% respiratory variability, suggesting right atrial pressure of 3 mmHg. IAS/Shunts: The interatrial septum was not well visualized. Additional Comments: 3D imaging was not performed.  LEFT VENTRICLE PLAX 2D LVIDd:         4.50 cm     Diastology LVIDs:         3.20 cm     LV e' medial:    7.13 cm/s LV PW:         1.00 cm     LV E/e' medial:  10.8 LV IVS:        0.80 cm     LV e' lateral:   7.99 cm/s LVOT diam:     1.80 cm     LV E/e' lateral: 9.6 LV SV:         49 LV SV Index:   31 LVOT Area:     2.54 cm  LV Volumes (MOD) LV vol d, MOD A2C: 75.3 ml LV vol d, MOD A4C: 66.0 ml LV vol s, MOD A2C: 39.0 ml LV vol s, MOD A4C: 35.1 ml LV SV MOD A2C:     36.3 ml LV SV MOD A4C:     66.0 ml LV SV MOD BP:      34.6 ml RIGHT VENTRICLE             IVC RV Basal diam:  3.80 cm     IVC diam: 1.30 cm RV S prime:     10.95 cm/s TAPSE (M-mode): 1.7 cm LEFT ATRIUM             Index        RIGHT ATRIUM           Index LA diam:        4.10 cm 2.58 cm/m   RA Area:     11.20 cm LA Vol (A2C):   35.3 ml 22.18 ml/m  RA Volume:   24.60 ml  15.46 ml/m LA Vol (A4C):   34.4 ml 21.62 ml/m LA Biplane Vol: 35.3 ml 22.18  ml/m  AORTIC VALVE AV Area (Vmax):    2.07 cm AV Area (Vmean):   1.99 cm AV Area (VTI):     2.25 cm AV Vmax:           115.50 cm/s AV Vmean:          79.628 cm/s AV VTI:            0.219 m AV Peak Grad:      5.3 mmHg AV Mean Grad:      2.8 mmHg LVOT Vmax:         94.13 cm/s LVOT Vmean:        62.267 cm/s LVOT VTI:          0.193 m LVOT/AV VTI ratio: 0.88  AORTA Ao Root diam: 3.10 cm MITRAL VALVE               TRICUSPID VALVE MV Area (PHT): 3.56 cm    TR Peak grad:   21.7 mmHg MV Decel Time: 213 msec    TR Vmax:        233.00 cm/s MV E velocity: 77.00 cm/s MV A velocity: 68.15 cm/s  SHUNTS MV E/A ratio:  1.13        Systemic VTI:  0.19 m                            Systemic Diam: 1.80 cm Yvonne Kendall MD Electronically signed by Yvonne Kendall MD Signature Date/Time: 01/11/2024/5:21:59 AM    Final    CARDIAC  CATHETERIZATION Result Date: 01/10/2024 Conclusions: Severe single-vessel coronary artery disease with thrombotic occlusion of the proximal LAD.  There is also mild, nonobstructive disease involving the proximal LCx and mid RCA. Myocardial bridging of the distal LAD noted. Severely reduced left ventricular systolic function (LVEF 25-35% with mid/apical hypokinesis/akinesis). Mildly elevated left ventricular filling pressure (LVEDP 18 mmHg, 25 mmHg post a-wave). Successful PCI to proximal LAD using Onyx Frontier 2.75 x 18 mm drug-eluting stent (postdilated to 3.3 mm) with 0% residual stenosis and TIMI-3 flow. Recommendations: Continue cangrelor infusion for 2 hours after ticagrelor load at the end of PCI. Dual antiplatelet therapy with aspirin and ticagrelor for at least 12 months. Aggressive secondary prevention; patient has been intolerant of simvastatin in the past and is reluctant to be rechallenged with statins.  If she does not wish to try a statin again, outpatient initiation of PCSK9 inhibitor, inclisiran, or bempedoic acid/ezetimibe will need to be considered. Follow-up echocardiogram. Maintain net even to slightly negative fluid balance. Initiate goal-directed medical therapy, as tolerated.  I will add low-dose metoprolol succinate to begin this evening. Yvonne Kendall, MD [facility]   DG Chest 2 View Result Date: 01/10/2024 CLINICAL DATA:  Mid chest pain. EXAM: CHEST - 2 VIEW COMPARISON:  12/11/2014. FINDINGS: Bilateral lung fields are clear. Bilateral costophrenic angles are clear. Normal cardio-mediastinal silhouette. No acute osseous abnormalities. The soft tissues are within normal limits. IMPRESSION: No active cardiopulmonary disease. Electronically Signed   By: Jules Schick M.D.   On: 01/10/2024 14:25    Microbiology: Results for orders placed or performed in visit on 06/12/13  Urine culture     Status: None   Collection Time: 06/12/13 12:12 PM   Specimen: Urine  Result Value Ref  Range Status   Culture ESCHERICHIA COLI  Final   Colony Count 20,OOO COLONIES/ML  Final   Organism ID, Bacteria ESCHERICHIA COLI  Final      Susceptibility  Escherichia coli -  (no method available)    AMPICILLIN <=2 Sensitive     AMPICILLIN/SULBACTAM <=2 Sensitive     PIP/TAZO <=4 Sensitive     IMIPENEM <=0.25 Sensitive     CEFAZOLIN <=4 Sensitive     CEFOXITIN <=4 Sensitive     CEFTRIAXONE <=1 Sensitive     CEFTAZIDIME <=1 Sensitive     CEFEPIME <=1 Sensitive     GENTAMICIN <=1 Sensitive     TOBRAMYCIN <=1 Sensitive     CIPROFLOXACIN <=0.25 Sensitive     LEVOFLOXACIN <=0.12 Sensitive     NITROFURANTOIN <=16 Sensitive     TRIMETH/SULFA <=20 Sensitive     Labs: CBC: Recent Labs  Lab 01/10/24 1311 01/11/24 0001 01/11/24 0351 01/12/24 0412  WBC 6.2 9.3 8.8 7.6  HGB 13.4 13.0 12.1 11.9*  HCT 39.9 37.5 34.7* 33.9*  MCV 91.5 88.7 87.6 88.1  PLT 233 215 192 182   Basic Metabolic Panel: Recent Labs  Lab 01/11/24 0001 01/11/24 0351 01/11/24 0942 01/11/24 1957 01/12/24 0412 01/13/24 0320 01/14/24 1003  NA 132*   < > 133* 129* 124* 138 136  K 3.2*   < > 3.3* 4.1 4.0 4.1 4.1  CL 101   < > 101 98 95* 107 105  CO2 18*   < > 20* 22 21* 22 22  GLUCOSE 182*   < > 177* 108* 122* 112* 99  BUN 15   < > 16 11 12 17 13   CREATININE 1.03*   < > 1.09* 0.69 0.68 0.67 0.79  CALCIUM 9.3   < > 9.2 9.2 8.9 9.1 10.0  MG 2.2  --   --   --  1.8 2.3  --   PHOS  --   --  4.0 3.0  --   --   --    < > = values in this interval not displayed.   Liver Function Tests: Recent Labs  Lab 01/10/24 1840 01/11/24 0001 01/11/24 0942 01/11/24 1957 01/12/24 0412 01/13/24 0320  AST 232* 504*  --   --  159* 77*  ALT 51* 173*  --   --  105* 80*  ALKPHOS 46 50  --   --  40 47  BILITOT 1.1 1.1  --   --  0.5 0.5  PROT 6.3* 6.2*  --   --  5.8* 6.2*  ALBUMIN 4.1 3.9 4.0 3.7 3.4* 3.7   CBG: Recent Labs  Lab 01/10/24 1822  GLUCAP 76    Discharge time spent: greater than 30 minutes.  This  record has been created using Conservation officer, historic buildings. Errors have been sought and corrected,but may not always be located. Such creation errors do not reflect on the standard of care.   Signed: Arnetha Courser, MD [facility] 01/14/2024

## 2024-01-14 NOTE — Progress Notes (Addendum)
 0800 Awake and alert. Patient very pleasant. Stated she was ready to go home. Secure chat sent to MDs. 1000 Dr. Odessa Fleming  in to see patient after Dr. Shanda Bumps visit.1100 Discharge teaching done. [id number] Multiple questions answered from patient. 1215 Patient getting dressed and when she put on life vest, poor patient connection alarm flashed. NP contacted for Life Vest representative's phone number. Life Vest representative (Emily)contacted and she alerted support tech to help patient. Patient instructed what to do to Life Vest. 1250 Patient discharged home with husband.

## 2024-01-16 ENCOUNTER — Telehealth: Payer: Self-pay | Admitting: Pharmacy Technician

## 2024-01-16 ENCOUNTER — Telehealth: Payer: Self-pay | Admitting: Cardiovascular Disease

## 2024-01-16 ENCOUNTER — Other Ambulatory Visit (HOSPITAL_COMMUNITY): Payer: Self-pay

## 2024-01-16 NOTE — Telephone Encounter (Signed)
 Patient wants to switch from Dr. Clifton James in [place] to Dr. Mariah Milling in [place].  Please confirm.

## 2024-01-16 NOTE — Telephone Encounter (Signed)
-----   Message from Cheree Ditto sent at 01/16/2024  7:55 AM EST ----- Regarding: RE: Repatha and Leqvio Coverage Good morning Sue Lush,   We may be able to get the Repatha covered by using a CAD/NSTEMI diagnosis code. I have attached the prior auth team to assist. ----- Message ----- From: Effie Shy, [facility] Sent: 01/13/2024   3:35 PM EST To: Olene Floss, RPH-CPP; # Subject: Repatha and Leqvio Coverage                    Hi Melissa and Rosary Lively, I'm a pharmacy resident at [facility]. This patient has a statin intolerance and the medical team was wanting to start them on Repatha or Leqvio. We tried to get a PA approved for Repatha, but it's been denied. Levqio needs to be processed on the outpatient side. I was asked by Raquel Rodriguez-Guzmen to reach out to you on behalf of this patient to work on a letter of necessity for Repatha.   Please let me know if you have any questions or need anymore information from me.   Hope you have a great weekend.  Best, Sue Lush

## 2024-01-16 NOTE — Telephone Encounter (Signed)
 Pharmacy Patient Advocate Encounter   Received notification from Pt Calls Messages that prior authorization for jaridance is required/requested.   Insurance verification completed.   The patient is insured through [employer] .   Per test claim: PA required; PA submitted to above mentioned insurance via CoverMyMeds Key/confirmation #/EOC [facility] Status is pending

## 2024-01-16 NOTE — Telephone Encounter (Signed)
 Pharmacy Patient Advocate Encounter   Received notification from Physician's Office that prior authorization for repatha is required/requested.   Insurance verification completed.   The patient is insured through [employer] .   Per test claim: PA required; PA submitted to above mentioned insurance via CoverMyMeds Key/confirmation #/EOC [id number] Status is pending

## 2024-01-16 NOTE — Telephone Encounter (Signed)
 Pt c/o medication issue:  1. Name of Medication:   empagliflozin (JARDIANCE) 10 MG TABS tablet   2. How are you currently taking this medication (dosage and times per day)?  As prescribed  3. Are you having a reaction (difficulty breathing--STAT)?   Unknown  4. What is your medication issue?   Patient stated she will need prior authorization to get this medication refilled at [facility] - [place], [place] - [address]

## 2024-01-17 ENCOUNTER — Telehealth: Payer: Self-pay | Admitting: Pharmacy Technician

## 2024-01-17 ENCOUNTER — Encounter: Payer: Self-pay | Admitting: Pharmacy Technician

## 2024-01-17 ENCOUNTER — Telehealth: Payer: Self-pay | Admitting: Cardiovascular Disease

## 2024-01-17 NOTE — Telephone Encounter (Signed)
 Pharmacy Patient Advocate Encounter  Received notification from [employer] that Prior Authorization for jardiance 10mg  has been APPROVED from 01/15/24 to 01/14/25. Spoke to pharmacy to process.Copay is $152.58 .  I got her a coupon but her pharmacy are not in contract with [facility] so she can use the coupon at another pharmacy. I called the patient and notified patient and I sent her a mychart with the coupon info   PA #/Case ID/Reference #: [id number]

## 2024-01-17 NOTE — Telephone Encounter (Signed)
 [facility] received a referral from the patient's PCP on 02/25 for a follow up and stated in documentation patient may benefit from stress test due to symptoms of stable angina.   Referral was viewed today and entered, as well as attached to patient's upcoming hospital f/u on 03/24 with Leslie Shepard.   Sending this encounter to [facility] tri[facility] due to pt requesting a provider switch to Dr. Mariah Milling that Dr. Clifton James had agreed upon.  Please advise if stress test should be ordered prior to ED f/u based on notes in referral/media from PCP.

## 2024-01-18 ENCOUNTER — Other Ambulatory Visit (HOSPITAL_COMMUNITY): Payer: Self-pay

## 2024-01-18 ENCOUNTER — Encounter: Payer: Self-pay | Admitting: Cardiology

## 2024-01-18 LAB — CYTOCHROME P450 2C19: 2C19 Metabolic Activity:: NORMAL

## 2024-01-18 NOTE — Telephone Encounter (Signed)
 Called and spoke with patient. Notified patient of the following from Dr. Mariah Milling.  No stress test needed,  She was in hospital with MI, stent placed to LAD  Thx  TGollan   Patient verbalizes understanding. Patient asking when she will have her MRI. Message sent to MRI scheduler.

## 2024-01-19 ENCOUNTER — Other Ambulatory Visit (HOSPITAL_COMMUNITY): Payer: Self-pay

## 2024-01-19 NOTE — Telephone Encounter (Signed)
 Sent over additional Statin intolerance documentation

## 2024-01-23 ENCOUNTER — Other Ambulatory Visit (HOSPITAL_COMMUNITY): Payer: Self-pay

## 2024-01-25 ENCOUNTER — Other Ambulatory Visit (HOSPITAL_COMMUNITY): Payer: Self-pay

## 2024-01-26 ENCOUNTER — Other Ambulatory Visit (HOSPITAL_COMMUNITY): Payer: Self-pay

## 2024-01-31 ENCOUNTER — Encounter: Payer: Self-pay | Admitting: Pharmacist

## 2024-01-31 ENCOUNTER — Other Ambulatory Visit (HOSPITAL_COMMUNITY): Payer: Self-pay

## 2024-01-31 NOTE — Telephone Encounter (Signed)
 Pharmacy Patient Advocate Encounter  Received notification from [employer] that Prior Authorization for Repatha has been APPROVED from 01/25/24 to 01/24/25. Ran test claim, Copay is $95.43- one month. This test claim was processed through [facility]- copay amounts may vary at other pharmacies due to pharmacy/plan contracts, or as the patient moves through the different stages of their insurance plan.   PA #/Case ID/Reference #: [id number]

## 2024-02-01 MED ORDER — REPATHA SURECLICK 140 MG/ML ~~LOC~~ SOAJ: 1.0000 mL | SUBCUTANEOUS | 11 refills | Status: AC

## 2024-02-01 NOTE — Addendum Note (Signed)
 Addended by: Malena Peer D on: 02/01/2024 01:11 PM   Modules accepted: Orders

## 2024-02-02 ENCOUNTER — Encounter: Attending: Internal Medicine | Admitting: *Deleted

## 2024-02-02 DIAGNOSIS — I252 Old myocardial infarction: Secondary | ICD-10-CM | POA: Insufficient documentation

## 2024-02-02 DIAGNOSIS — Z48812 Encounter for surgical aftercare following surgery on the circulatory system: Secondary | ICD-10-CM | POA: Insufficient documentation

## 2024-02-02 DIAGNOSIS — I213 ST elevation (STEMI) myocardial infarction of unspecified site: Secondary | ICD-10-CM

## 2024-02-02 DIAGNOSIS — Z955 Presence of coronary angioplasty implant and graft: Secondary | ICD-10-CM | POA: Insufficient documentation

## 2024-02-02 NOTE — Progress Notes (Signed)
 Initial phone call completed. Diagnosis can be found in [facility] 2/25. EP Orientation scheduled for Tuesday 3/25 at 1:30.

## 2024-02-03 NOTE — Progress Notes (Signed)
 Cardiology Clinic Note   Date: 02/06/2024 ID: ADALEI NOVELL, DOB 1961/04/25, MRN [id number]  Primary Cardiologist:  Julien Nordmann, MD  Chief Complaint   Leslie Shepard is a 63 y.o. female who presents to the clinic today for hospital follow up.   Patient Profile   Leslie Shepard is followed by Dr. Mariah Milling for the history outlined below.      Past medical history significant for: CAD. LHC 01/10/2024 (STEMI): Severe single-vessel CAD with thrombotic occlusion of proximal LAD.  Mild nonobstructive disease involving the proximal LCx and mid RCA.  Myocardial bridging of the distal LAD noted.  PCI with DES 2.75 x 18 mm to proximal LAD.  Severely reduced LV function with mid/apical hypokinesis/akinesis, EF 25 to 35%.  Mildly elevated LVEDP. Cardiac arrest/Torsades de point. Chronic HFrEF. Echo 01/10/2024: EF 35 to 40%.  Mild asymmetric LVH of the basal-septal segment.  Grade II DD.  Low normal RV function/normal RV size.  Normal PA pressure.  Mild MR. Hyperlipidemia. Lipid panel 01/11/2024: LDL 155, HDL 60, TG 68, total 229. LPa 01/11/2024: 105.6.  In summary, patient was initially evaluated by Dr. Clifton James on 10/17/2023 for chest pain.  EKG November 2024 demonstrated sinus rhythm with T wave abnormality.  She reported chest pressure at peak exertion with associated dyspnea.  Symptoms resolved with rest.  She was scheduled for coronary CTA and echo.  Testing was never pursued as patient's symptoms improved.    Patient presented to the ED on 01/10/2024 with midsternal chest pain radiating across chest and down bilateral arms.  She reported sitting made it worse with standing and walking improved symptoms.  EKG concerning for STEMI.  She reported a 10 to 12-day history of recurrent exertional chest pain sometimes lasting up to an hour and decreasing with rest.  On the day of arrival she reported chest pain with a rash described as pressure worsening when bending forward or raising her arms.  Pain  somewhat improved with aspirin and SL NTG.  Initial labs: WBC 6.2, hemoglobin 13.4, sodium 136, potassium 3.6, creatinine 0.72, BUN 15.  Troponin 64>> 310>> 24,000.  Chest x-ray with no active cardiopulmonary disease.  Patient was taken emergently to the Cath Lab and underwent PCI with DES to proximal LAD as detailed above.  Echo demonstrated EF 35 to 40% as detailed above.  Postcatheterization patient developed a hematoma on right wrist cath site with TR band still in place.  Manual pressure was held for 10 minutes with no improvement.  Manual BP cuff applied and maintained for 15 minutes with improved appearance.  Hospital course further complicated V-fib arrest requiring CPR defibrillation x 1 before ROSC was achieved.  Amiodarone drip was implemented shortly thereafter patient went into torsades with altered mental status.  While team was preparing to shock patient she converted to sinus rhythm and regained alertness.  2 g magnesium sulfate administered.  Patient again went into what appeared to be torsades and was defibrillated returning to sinus rhythm.  IV lidocaine was started.  Patient was evaluated by EP with plan to remain on IV amiodarone and lidocaine for 24 hours.  If patient remains stable will transition lidocaine to p.o. mexiletine and oral amiodarone.  Patient will be discharged with LifeVest and scheduled for an outpatient cardiac MRI.  Patient was discharged on 01/14/2024.     History of Present Illness    Today, patient is accompanied by her husband. She reports she is doing well since hospital discharge. Patient denies shortness of  breath, dyspnea on exertion, lower extremity edema, orthopnea or PND. No chest pain, pressure, or tightness. No palpitations. Discussed heart catheterization and echo results in detail with patient. She has a lot of questions about her medications and conditions. All questions answered. Right wrist cath site is well healed with no tenderness. She is planning to go  out of the [place] in a couple of months and wants to know if it is safe to do so. Discussed I would like Dr. Lalla Brothers to weigh in on her travel plans when she sees him next month. She voiced understanding.     ROS: All other systems reviewed and are otherwise negative except as noted in History of Present Illness.  EKGs/Labs Reviewed    EKG Interpretation Date/Time:  Monday February 06 2024 08:50:53 EDT Ventricular Rate:  61 PR Interval:  172 QRS Duration:  72 QT Interval:  400 QTC Calculation: 402 R Axis:   77  Text Interpretation: Normal sinus rhythm Low voltage QRS Cannot rule out Anteroseptal infarct , age undetermined When compared with ECG of 11-Jan-2024 08:32, PREVIOUS ECG IS PRESENT Confirmed by Carlos Levering [phone]) on 02/06/2024 9:10:37 AM   01/13/2024: ALT 80; AST 77 01/14/2024: BUN 13; Creatinine, Ser 0.79; Potassium 4.1; Sodium 136   01/12/2024: Hemoglobin 11.9; WBC 7.6   10/04/2023: TSH 2.18    Physical Exam    VS:  BP 122/72   Pulse 61   Ht 5\' 4"  (1.626 m)   Wt 124 lb 6.4 oz (56.4 kg)   SpO2 99%   BMI 21.35 kg/m  , BMI Body mass index is 21.35 kg/m.  GEN: Well nourished, well developed, in no acute distress. Neck: No JVD or carotid bruits. Cardiac:  RRR. No murmurs. No rubs or gallops.  LifeVest in place.  Respiratory:  Respirations regular and unlabored. Clear to auscultation without rales, wheezing or rhonchi. GI: Soft, nontender, nondistended. Extremities: Radials/DP/PT 2+ and equal bilaterally. No clubbing or cyanosis. No edema. Right wrist site with resolving ecchymosis, no edema or erythema.  Skin: Warm and dry, no rash. Neuro: Strength intact.  Assessment & Plan   CAD S/p PCI with DES to proximal LAD in the setting of STEMI February 2025.  Patient denies chest pain, pressure or tightness. She is walking with good tolerance. She is very excited about starting cardiac rehab. Patient is willing to stay on effient for now, as she has met her deductible  and does not think it will be expensive. If she finds it is cost prohibitive she will contact the office so she can be changed to Plavix.  -Continue aspirin, Effient, Toprol, Repatha, Zetia.  Chronic HFrEF Echo February 2025 showed EF 35 to 40%, LVH, Grade II DD, low normal RV function/normal RV size, normal PA pressure, mild MR.  Patient denies lower extremity edema, DOE, orthopnea or PND. Euvolemic and well compensated on exam. -Continue Toprol, Jardiance. -Add losartan 12.5 mg in the evening.  -CMP and CBC today. -Repeat BMP in 2 weeks.   Cardiac arrest/torsades de point Patient experienced several episodes of torsades de point post successful PCI requiring CPR and defibrillation February 2025.  Patient received IV amiodarone and lidocaine and was transitioned to mexiletine and p.o. amiodarone.  She was discharged from the hospital on LifeVest.  Patient denies lightheadedness, dizziness, presyncope, syncope. -Continue to wear LifeVest. -Keep scheduled cardiac MRI 03/02/2024 and follow-up with EP 03/07/2024.  Hyperlipidemia LDL February 2025 155, not at goal.  Patient has a history of myalgias on high intensity statin.  She has stopped rosuvastatin as she was approved for Repatha. She brings in her first dose today for assistance in administering it.  -Continue Repatha and Zetia. -Repeat lipid panel and LFTs at follow up.   Disposition: CBC and CMP today. Add Losartan 12.5 mg nightly. Repeat BMP in 2 weeks. Patient is cleared to start cardiac rehab.  Return in 3 months or sooner as needed.         Signed, Etta Grandchild. Zyheir Daft, DNP, NP-C

## 2024-02-06 ENCOUNTER — Encounter: Payer: Self-pay | Admitting: Student

## 2024-02-06 ENCOUNTER — Ambulatory Visit: Attending: Student | Admitting: Student

## 2024-02-06 VITALS — BP 122/72 | HR 61 | Ht 64.0 in | Wt 124.4 lb

## 2024-02-06 DIAGNOSIS — I469 Cardiac arrest, cause unspecified: Secondary | ICD-10-CM | POA: Diagnosis not present

## 2024-02-06 DIAGNOSIS — Z79899 Other long term (current) drug therapy: Secondary | ICD-10-CM | POA: Diagnosis not present

## 2024-02-06 DIAGNOSIS — I2510 Atherosclerotic heart disease of native coronary artery without angina pectoris: Secondary | ICD-10-CM

## 2024-02-06 DIAGNOSIS — R030 Elevated blood-pressure reading, without diagnosis of hypertension: Secondary | ICD-10-CM | POA: Diagnosis not present

## 2024-02-06 DIAGNOSIS — E785 Hyperlipidemia, unspecified: Secondary | ICD-10-CM | POA: Diagnosis not present

## 2024-02-06 DIAGNOSIS — I4721 Torsades de pointes: Secondary | ICD-10-CM

## 2024-02-06 DIAGNOSIS — I255 Ischemic cardiomyopathy: Secondary | ICD-10-CM

## 2024-02-06 DIAGNOSIS — I5022 Chronic systolic (congestive) heart failure: Secondary | ICD-10-CM

## 2024-02-06 DIAGNOSIS — I5021 Acute systolic (congestive) heart failure: Secondary | ICD-10-CM

## 2024-02-06 MED ORDER — LOSARTAN POTASSIUM 25 MG PO TABS: 12.5000 mg | ORAL_TABLET | Freq: Every evening | ORAL | 3 refills | Status: DC

## 2024-02-06 MED ORDER — LOSARTAN POTASSIUM 25 MG PO TABS: 12.5000 mg | ORAL_TABLET | Freq: Every day | ORAL | 3 refills | Status: DC

## 2024-02-06 NOTE — Patient Instructions (Signed)
 Medication Instructions:  Your physician recommends the following medication changes.  START TAKING: Losartan 12.5 mg every evening  *If you need a refill on your cardiac medications before your next appointment, please call your pharmacy*   Lab Work: Your provider would like for you to have following labs drawn today CBC and CMeT.   If you have labs (blood work) drawn today and your tests are completely normal, you will receive your results only by: MyChart Message (if you have MyChart) OR A paper copy in the mail If you have any lab test that is abnormal or we need to change your treatment, we will call you to review the results.  Your provider would like for you to return in 2 weeks to have the following labs drawn: BMeT.   Please go to [facility] [address] ([facility] [address], [place] [address] You do not need an appointment.  They are open from 8 am- 4:30 pm.  Lunch from 1:00 pm- 2:00 pm You DO NOT need to be fasting.   Testing/Procedures: Cardiac Rehab referral placed   Follow-Up: At [facility], you and your health needs are our priority.  As part of our continuing mission to provide you with exceptional heart care, we have created designated Provider Care Teams.  These Care Teams include your primary Cardiologist (physician) and Advanced Practice Providers (APPs -  Physician Assistants and Nurse Practitioners) who all work together to provide you with the care you need, when you need it.   Your next appointment:   3-4 month(s)  Provider:   You may see Julien Nordmann, MD or Carlos Levering, NP   Follow-up with Dr Lalla Brothers on 03/07/2024

## 2024-02-07 ENCOUNTER — Encounter

## 2024-02-07 VITALS — Ht 63.94 in | Wt 130.7 lb

## 2024-02-07 DIAGNOSIS — Z955 Presence of coronary angioplasty implant and graft: Secondary | ICD-10-CM | POA: Diagnosis not present

## 2024-02-07 DIAGNOSIS — Z48812 Encounter for surgical aftercare following surgery on the circulatory system: Secondary | ICD-10-CM | POA: Diagnosis not present

## 2024-02-07 DIAGNOSIS — I213 ST elevation (STEMI) myocardial infarction of unspecified site: Secondary | ICD-10-CM

## 2024-02-07 DIAGNOSIS — I252 Old myocardial infarction: Secondary | ICD-10-CM | POA: Diagnosis not present

## 2024-02-07 LAB — CBC
Hematocrit: 42.3 % (ref 34.0–46.6)
Hemoglobin: 13.4 g/dL (ref 11.1–15.9)
MCH: 30.1 pg (ref 26.6–33.0)
MCHC: 31.7 g/dL (ref 31.5–35.7)
MCV: 95 fL (ref 79–97)
Platelets: 256 10*3/uL (ref 150–450)
RBC: 4.45 x10E6/uL (ref 3.77–5.28)
RDW: 13.4 % (ref 11.7–15.4)
WBC: 5 10*3/uL (ref 3.4–10.8)

## 2024-02-07 LAB — COMPREHENSIVE METABOLIC PANEL
ALT: 19 IU/L (ref 0–32)
AST: 15 IU/L (ref 0–40)
Albumin: 4.6 g/dL (ref 3.9–4.9)
Alkaline Phosphatase: 72 IU/L (ref 44–121)
BUN/Creatinine Ratio: 17 (ref 12–28)
BUN: 17 mg/dL (ref 8–27)
Bilirubin Total: 0.3 mg/dL (ref 0.0–1.2)
CO2: 21 mmol/L (ref 20–29)
Calcium: 10 mg/dL (ref 8.7–10.3)
Chloride: 101 mmol/L (ref 96–106)
Creatinine, Ser: 0.98 mg/dL (ref 0.57–1.00)
Globulin, Total: 2.3 g/dL (ref 1.5–4.5)
Glucose: 89 mg/dL (ref 70–99)
Potassium: 4.6 mmol/L (ref 3.5–5.2)
Sodium: 138 mmol/L (ref 134–144)
Total Protein: 6.9 g/dL (ref 6.0–8.5)
eGFR: 65 mL/min/{1.73_m2} (ref >59)

## 2024-02-07 NOTE — Patient Instructions (Signed)
 Patient Instructions  Patient Details  Name: Leslie Shepard MRN: [id number] Date of Birth: May 10, 1961 Referring Provider:  Yvonne Kendall, MD  Below are your personal goals for exercise, nutrition, and risk factors. Our goal is to help you stay on track towards obtaining and maintaining these goals. We will be discussing your progress on these goals with you throughout the program.  Initial Exercise Prescription:  Initial Exercise Prescription - 02/07/24 1500       Date of Initial Exercise RX and Referring Provider   Date 02/07/24    Referring Provider Dr. Cristal Deer End      Oxygen   Maintain Oxygen Saturation 88% or higher      Treadmill   MPH 3    Grade 2    Minutes 15    METs 4.1      Elliptical   Level 1    Speed 3    Minutes 15    METs 4.1      REL-XR   Level 4    Watts 25    Speed 50    Minutes 15    METs 4.1      Intensity   THRR 40-80% of Max Heartrate 101-139    Ratings of Perceived Exertion 11-13    Perceived Dyspnea 0-4      Resistance Training   Training Prescription Yes    Weight 4lb    Reps 10-15             Exercise Goals: Frequency: Be able to perform aerobic exercise two to three times per week in program working toward 2-5 days per week of home exercise.  Intensity: Work with a perceived exertion of 11 (fairly light) - 15 (hard) while following your exercise prescription.  We will make changes to your prescription with you as you progress through the program.   Duration: Be able to do 30 to 45 minutes of continuous aerobic exercise in addition to a 5 minute warm-up and a 5 minute cool-down routine.   Nutrition Goals: Your personal nutrition goals will be established when you do your nutrition analysis with the dietician.  The following are general nutrition guidelines to follow: Cholesterol < 200mg /day Sodium < 1500mg /day Fiber: Women over 50 yrs - 21 grams per day  Personal Goals:  Personal Goals and Risk Factors at  Admission - 02/02/24 1234       Core Components/Risk Factors/Patient Goals on Admission   Heart Failure Yes    Intervention Provide a combined exercise and nutrition program that is supplemented with education, support and counseling about heart failure. Directed toward relieving symptoms such as shortness of breath, decreased exercise tolerance, and extremity edema.    Expected Outcomes Improve functional capacity of life;Short term: Attendance in program 2-3 days a week with increased exercise capacity. Reported lower sodium intake. Reported increased fruit and vegetable intake. Reports medication compliance.;Short term: Daily weights obtained and reported for increase. Utilizing diuretic protocols set by physician.;Long term: Adoption of self-care skills and reduction of barriers for early signs and symptoms recognition and intervention leading to self-care maintenance.    Lipids Yes    Intervention Provide education and support for participant on nutrition & aerobic/resistive exercise along with prescribed medications to achieve LDL 70mg , HDL >40mg .    Expected Outcomes Short Term: Participant states understanding of desired cholesterol values and is compliant with medications prescribed. Participant is following exercise prescription and nutrition guidelines.;Long Term: Cholesterol controlled with medications as prescribed, with individualized exercise RX and with  personalized nutrition plan. Value goals: LDL < 70mg , HDL > 40 mg.             Exercise Goals and Review:  Exercise Goals     Row Name 02/07/24 1536             Exercise Goals   Increase Physical Activity Yes       Intervention Provide advice, education, support and counseling about physical activity/exercise needs.;Develop an individualized exercise prescription for aerobic and resistive training based on initial evaluation findings, risk stratification, comorbidities and participant's personal goals.       Expected  Outcomes Short Term: Attend rehab on a regular basis to increase amount of physical activity.;Long Term: Exercising regularly at least 3-5 days a week.;Long Term: Add in home exercise to make exercise part of routine and to increase amount of physical activity.       Increase Strength and Stamina Yes       Intervention Develop an individualized exercise prescription for aerobic and resistive training based on initial evaluation findings, risk stratification, comorbidities and participant's personal goals.;Provide advice, education, support and counseling about physical activity/exercise needs.       Expected Outcomes Long Term: Improve cardiorespiratory fitness, muscular endurance and strength as measured by increased METs and functional capacity ( );Short Term: Perform resistance training exercises routinely during rehab and add in resistance training at home;Short Term: Increase workloads from initial exercise prescription for resistance, speed, and METs.       Able to understand and use rate of perceived exertion (RPE) scale Yes       Intervention Provide education and explanation on how to use RPE scale       Expected Outcomes Long Term:  Able to use RPE to guide intensity level when exercising independently;Short Term: Able to use RPE daily in rehab to express subjective intensity level       Able to understand and use Dyspnea scale Yes       Intervention Provide education and explanation on how to use Dyspnea scale       Expected Outcomes Short Term: Able to use Dyspnea scale daily in rehab to express subjective sense of shortness of breath during exertion;Long Term: Able to use Dyspnea scale to guide intensity level when exercising independently       Knowledge and understanding of Target Heart Rate Range (THRR) Yes       Intervention Provide education and explanation of THRR including how the numbers were predicted and where they are located for reference       Expected Outcomes Long Term:  Able to use THRR to govern intensity when exercising independently;Short Term: Able to use daily as guideline for intensity in rehab;Short Term: Able to state/look up THRR       Able to check pulse independently Yes       Intervention Review the importance of being able to check your own pulse for safety during independent exercise;Provide education and demonstration on how to check pulse in carotid and radial arteries.       Expected Outcomes Long Term: Able to check pulse independently and accurately;Short Term: Able to explain why pulse checking is important during independent exercise       Understanding of Exercise Prescription Yes       Intervention Provide education, explanation, and written materials on patient's individual exercise prescription       Expected Outcomes Long Term: Able to explain home exercise prescription to exercise independently;Short Term: Able to explain  program exercise prescription                Copy of goals given to participant.

## 2024-02-07 NOTE — Progress Notes (Signed)
 Cardiac Individual Treatment Plan  Patient Details  Name: Leslie Shepard MRN: [id number] Date of Birth: Sep 12, 1961 Referring Provider:   Flowsheet Row Cardiac Rehab from 02/07/2024 in [facility] Cardiac and Pulmonary Rehab  Referring Provider Dr. Cristal Deer End       Initial Encounter Date:  Flowsheet Row Cardiac Rehab from 02/07/2024 in [facility] Cardiac and Pulmonary Rehab  Date 02/07/24       Visit Diagnosis: ST elevation myocardial infarction (STEMI), unspecified artery [facility])  Status post coronary artery stent placement  Patient's Home Medications on Admission:  Current Outpatient Medications:    amiodarone (PACERONE) 200 MG tablet, Take 1 tablet (200 mg total) by mouth 2 (two) times daily for 12 days, THEN 1 tablet (200 mg total) daily., Disp: 114 tablet, Rfl: 0   aspirin 81 MG chewable tablet, Chew 1 tablet (81 mg total) by mouth daily., Disp: 90 tablet, Rfl: 1   empagliflozin (JARDIANCE) 10 MG TABS tablet, Take 1 tablet (10 mg total) by mouth daily., Disp: 30 tablet, Rfl: 2   Evolocumab (REPATHA SURECLICK) 140 MG/ML SOAJ, Inject 140 mg into the skin every 14 (fourteen) days., Disp: 2 mL, Rfl: 11   ezetimibe (ZETIA) 10 MG tablet, Take 1 tablet (10 mg total) by mouth daily., Disp: 90 tablet, Rfl: 1   losartan (COZAAR) 25 MG tablet, Take 0.5 tablets (12.5 mg total) by mouth every evening., Disp: 45 tablet, Rfl: 3   metoprolol succinate (TOPROL-XL) 25 MG 24 hr tablet, Take 0.5 tablets (12.5 mg total) by mouth daily., Disp: 30 tablet, Rfl: 2   mexiletine (MEXITIL) 150 MG capsule, Take 1 capsule (150 mg total) by mouth every 12 (twelve) hours., Disp: 60 capsule, Rfl: 2   oxyCODONE (OXY IR/ROXICODONE) 5 MG immediate release tablet, Take 1 tablet (5 mg total) by mouth every 6 (six) hours as needed for moderate pain (pain score 4-6). (Patient not taking: Reported on 02/02/2024), Disp: 30 tablet, Rfl: 0   polyethylene glycol (MIRALAX / GLYCOLAX) 17 g packet, Take 17 g by mouth daily. (Patient not  taking: Reported on 02/02/2024), Disp: 14 each, Rfl: 0   prasugrel (EFFIENT) 5 MG TABS tablet, Take 1 tablet (5 mg total) by mouth daily., Disp: 30 tablet, Rfl: 2   rosuvastatin (CRESTOR) 10 MG tablet, Take 1 tablet (10 mg total) by mouth daily. In evening with a low fat snack (Patient not taking: Reported on 02/02/2024), Disp: 30 tablet, Rfl: 1  Past Medical History: Past Medical History:  Diagnosis Date   Hyperlipidemia    PMDD (premenstrual dysphoric disorder)     Tobacco Use: Social History   Tobacco Use  Smoking Status Never  Smokeless Tobacco Never    Labs: Review Flowsheet  More data exists      Latest Ref Rng & Units 12/24/2009 02/16/2013 01/17/2019 10/04/2023 01/11/2024  Labs for ITP Cardiac and Pulmonary Rehab  Cholestrol 0 - 200 mg/dL 782  956  213  086  578   LDL (calc) 0 - 99 mg/dL 469  - 629  528  413   Direct LDL mg/dL - 244.0  - - -  HDL-C >10 mg/dL 27.25  36.64  40.34  74.25  60   Trlycerides <150 mg/dL 95.6  387.5  643.3  295.1  68      Exercise Target Goals: Exercise Program Goal: Individual exercise prescription set using results from initial 6 min walk test and THRR while considering  patient's activity barriers and safety.   Exercise Prescription Goal: Initial exercise prescription builds to  30-45 minutes a day of aerobic activity, 2-3 days per week.  Home exercise guidelines will be given to patient during program as part of exercise prescription that the participant will acknowledge.   Education: Aerobic Exercise: - Group verbal and visual presentation on the components of exercise prescription. Introduces F.I.T.T principle from ACSM for exercise prescriptions.  Reviews F.I.T.T. principles of aerobic exercise including progression. Written material given at graduation. Flowsheet Row Cardiac Rehab from 02/07/2024 in [facility] Cardiac and Pulmonary Rehab  Education need identified 02/07/24       Education: Resistance Exercise: - Group verbal and visual  presentation on the components of exercise prescription. Introduces F.I.T.T principle from ACSM for exercise prescriptions  Reviews F.I.T.T. principles of resistance exercise including progression. Written material given at graduation.    Education: Exercise & Equipment Safety: - Individual verbal instruction and demonstration of equipment use and safety with use of the equipment. Flowsheet Row Cardiac Rehab from 02/07/2024 in [facility] Cardiac and Pulmonary Rehab  Date 02/07/24  Educator [facility]  Instruction Review Code 1- Verbalizes Understanding       Education: Exercise Physiology & General Exercise Guidelines: - Group verbal and written instruction with models to review the exercise physiology of the cardiovascular system and associated critical values. Provides general exercise guidelines with specific guidelines to those with heart or lung disease.    Education: Flexibility, Balance, Mind/Body Relaxation: - Group verbal and visual presentation with interactive activity on the components of exercise prescription. Introduces F.I.T.T principle from ACSM for exercise prescriptions. Reviews F.I.T.T. principles of flexibility and balance exercise training including progression. Also discusses the mind body connection.  Reviews various relaxation techniques to help reduce and manage stress (i.e. Deep breathing, progressive muscle relaxation, and visualization). Balance handout provided to take home. Written material given at graduation.   Activity Barriers & Risk Stratification:  Activity Barriers & Cardiac Risk Stratification - 02/07/24 1533       Activity Barriers & Cardiac Risk Stratification   Activity Barriers None    Cardiac Risk Stratification High             6 Minute Walk:  6 Minute Walk     Row Name 02/07/24 1532         6 Minute Walk   Phase Initial     Distance 1620 feet     Walk Time 6 minutes     # of Rest Breaks 0     MPH 3.07     METS 4.1     RPE 9     Perceived  Dyspnea  0     VO2 Peak 14.4     Symptoms No     Resting HR 63 bpm     Resting BP 118/64     Resting Oxygen Saturation  97 %     Exercise Oxygen Saturation  during 6 min walk 99 %     Max Ex. HR 96 bpm     Max Ex. BP 138/68     2 Minute Post BP 122/66              Oxygen Initial Assessment:   Oxygen Re-Evaluation:   Oxygen Discharge (Final Oxygen Re-Evaluation):   Initial Exercise Prescription:  Initial Exercise Prescription - 02/07/24 1500       Date of Initial Exercise RX and Referring Provider   Date 02/07/24    Referring Provider Dr. Cristal Deer End      Oxygen   Maintain Oxygen Saturation 88% or higher  Treadmill   MPH 3    Grade 2    Minutes 15    METs 4.1      Elliptical   Level 1    Speed 3    Minutes 15    METs 4.1      REL-XR   Level 4    Watts 25    Speed 50    Minutes 15    METs 4.1      Intensity   THRR 40-80% of Max Heartrate 101-139    Ratings of Perceived Exertion 11-13    Perceived Dyspnea 0-4      Resistance Training   Training Prescription Yes    Weight 4lb    Reps 10-15             Perform Capillary Blood Glucose checks as needed.  Exercise Prescription Changes:   Exercise Prescription Changes     Row Name 02/07/24 1500             Response to Exercise   Blood Pressure (Admit) 118/64       Blood Pressure (Exercise) 138/68       Blood Pressure (Exit) 122/66       Heart Rate (Admit) 63 bpm       Heart Rate (Exercise) 96 bpm       Heart Rate (Exit) 59 bpm       Oxygen Saturation (Admit) 97 %       Oxygen Saturation (Exercise) 99 %       Oxygen Saturation (Exit) 99 %       Rating of Perceived Exertion (Exercise) 9       Perceived Dyspnea (Exercise) 0       Symptoms none       Comments results                Exercise Comments:   Exercise Goals and Review:   Exercise Goals     Row Name 02/07/24 1536             Exercise Goals   Increase Physical Activity Yes        Intervention Provide advice, education, support and counseling about physical activity/exercise needs.;Develop an individualized exercise prescription for aerobic and resistive training based on initial evaluation findings, risk stratification, comorbidities and participant's personal goals.       Expected Outcomes Short Term: Attend rehab on a regular basis to increase amount of physical activity.;Long Term: Exercising regularly at least 3-5 days a week.;Long Term: Add in home exercise to make exercise part of routine and to increase amount of physical activity.       Increase Strength and Stamina Yes       Intervention Develop an individualized exercise prescription for aerobic and resistive training based on initial evaluation findings, risk stratification, comorbidities and participant's personal goals.;Provide advice, education, support and counseling about physical activity/exercise needs.       Expected Outcomes Long Term: Improve cardiorespiratory fitness, muscular endurance and strength as measured by increased METs and functional capacity ( );Short Term: Perform resistance training exercises routinely during rehab and add in resistance training at home;Short Term: Increase workloads from initial exercise prescription for resistance, speed, and METs.       Able to understand and use rate of perceived exertion (RPE) scale Yes       Intervention Provide education and explanation on how to use RPE scale       Expected Outcomes Long Term:  Able to  use RPE to guide intensity level when exercising independently;Short Term: Able to use RPE daily in rehab to express subjective intensity level       Able to understand and use Dyspnea scale Yes       Intervention Provide education and explanation on how to use Dyspnea scale       Expected Outcomes Short Term: Able to use Dyspnea scale daily in rehab to express subjective sense of shortness of breath during exertion;Long Term: Able to use Dyspnea scale to  guide intensity level when exercising independently       Knowledge and understanding of Target Heart Rate Range (THRR) Yes       Intervention Provide education and explanation of THRR including how the numbers were predicted and where they are located for reference       Expected Outcomes Long Term: Able to use THRR to govern intensity when exercising independently;Short Term: Able to use daily as guideline for intensity in rehab;Short Term: Able to state/look up THRR       Able to check pulse independently Yes       Intervention Review the importance of being able to check your own pulse for safety during independent exercise;Provide education and demonstration on how to check pulse in carotid and radial arteries.       Expected Outcomes Long Term: Able to check pulse independently and accurately;Short Term: Able to explain why pulse checking is important during independent exercise       Understanding of Exercise Prescription Yes       Intervention Provide education, explanation, and written materials on patient's individual exercise prescription       Expected Outcomes Long Term: Able to explain home exercise prescription to exercise independently;Short Term: Able to explain program exercise prescription                Exercise Goals Re-Evaluation :   Discharge Exercise Prescription (Final Exercise Prescription Changes):  Exercise Prescription Changes - 02/07/24 1500       Response to Exercise   Blood Pressure (Admit) 118/64    Blood Pressure (Exercise) 138/68    Blood Pressure (Exit) 122/66    Heart Rate (Admit) 63 bpm    Heart Rate (Exercise) 96 bpm    Heart Rate (Exit) 59 bpm    Oxygen Saturation (Admit) 97 %    Oxygen Saturation (Exercise) 99 %    Oxygen Saturation (Exit) 99 %    Rating of Perceived Exertion (Exercise) 9    Perceived Dyspnea (Exercise) 0    Symptoms none    Comments results             Nutrition:  Target Goals: Understanding of nutrition  guidelines, daily intake of sodium 1500mg , cholesterol 200mg , calories 30% from fat and 7% or less from saturated fats, daily to have 5 or more servings of fruits and vegetables.  Education: All About Nutrition: -Group instruction provided by verbal, written material, interactive activities, discussions, models, and posters to present general guidelines for heart healthy nutrition including fat, fiber, MyPlate, the role of sodium in heart healthy nutrition, utilization of the nutrition label, and utilization of this knowledge for meal planning. Follow up email sent as well. Written material given at graduation. Flowsheet Row Cardiac Rehab from 02/07/2024 in [facility] Cardiac and Pulmonary Rehab  Education need identified 02/07/24       Biometrics:  Pre Biometrics - 02/07/24 1536       Pre Biometrics   Height 5' 3.94" (  1.624 m)    Weight 130 lb 11.2 oz (59.3 kg)    Waist Circumference 28 inches    Hip Circumference 35 inches    Waist to Hip Ratio 0.8 %    BMI (Calculated) 22.48    Single Leg Stand 30 seconds              Nutrition Therapy Plan and Nutrition Goals:   Nutrition Assessments:  MEDIFICTS Score Key: >=70 Need to make dietary changes  40-70 Heart Healthy Diet <= 40 Therapeutic Level Cholesterol Diet  Flowsheet Row Cardiac Rehab from 02/02/2024 in [facility] Cardiac and Pulmonary Rehab  Picture Your Plate Total Score on Admission 83      Picture Your Plate Scores: <82 Unhealthy dietary pattern with much room for improvement. 41-50 Dietary pattern unlikely to meet recommendations for good health and room for improvement. 51-60 More healthful dietary pattern, with some room for improvement.  >60 Healthy dietary pattern, although there may be some specific behaviors that could be improved.    Nutrition Goals Re-Evaluation:   Nutrition Goals Discharge (Final Nutrition Goals Re-Evaluation):   Psychosocial: Target Goals: Acknowledge presence or absence of significant  depression and/or stress, maximize coping skills, provide positive support system. Participant is able to verbalize types and ability to use techniques and skills needed for reducing stress and depression.   Education: Stress, Anxiety, and Depression - Group verbal and visual presentation to define topics covered.  Reviews how body is impacted by stress, anxiety, and depression.  Also discusses healthy ways to reduce stress and to treat/manage anxiety and depression.  Written material given at graduation.   Education: Sleep Hygiene -Provides group verbal and written instruction about how sleep can affect your health.  Define sleep hygiene, discuss sleep cycles and impact of sleep habits. Review good sleep hygiene tips.    Initial Review & Psychosocial Screening:  Initial Psych Review & Screening - 02/02/24 1237       Initial Review   Current issues with None Identified      Family Dynamics   Good Support System? Yes   family. friends     Barriers   Psychosocial barriers to participate in program There are no identifiable barriers or psychosocial needs.      Screening Interventions   Interventions Encouraged to exercise;Provide feedback about the scores to participant;To provide support and resources with identified psychosocial needs    Expected Outcomes Short Term goal: Utilizing psychosocial counselor, staff and physician to assist with identification of specific Stressors or current issues interfering with healing process. Setting desired goal for each stressor or current issue identified.;Long Term Goal: Stressors or current issues are controlled or eliminated.;Short Term goal: Identification and review with participant of any Quality of Life or Depression concerns found by scoring the questionnaire.;Long Term goal: The participant improves quality of Life and PHQ9 Scores as seen by post scores and/or verbalization of changes             Quality of Life Scores:   Scores of 19  and below usually indicate a poorer quality of life in these areas.  A difference of  2-3 points is a clinically meaningful difference.  A difference of 2-3 points in the total score of the Quality of Life Index has been associated with significant improvement in overall quality of life, self-image, physical symptoms, and general health in studies assessing change in quality of life.  PHQ-9: Review Flowsheet       10/11/2023 03/22/2023 01/17/2019  Depression screen  PHQ 2/9  Decreased Interest 0 0 0  Down, Depressed, Hopeless 0 0 0  PHQ - 2 Score 0 0 0  Altered sleeping 0 0 -  Tired, decreased energy 0 0 -  Change in appetite 0 0 -  Feeling bad or failure about yourself  0 0 -  Trouble concentrating 0 0 -  Moving slowly or fidgety/restless 0 0 -  Suicidal thoughts 0 0 -  PHQ-9 Score 0 0 -  Difficult doing work/chores Not difficult at all Not difficult at all -   Interpretation of Total Score  Total Score Depression Severity:  1-4 = Minimal depression, 5-9 = Mild depression, 10-14 = Moderate depression, 15-19 = Moderately severe depression, 20-27 = Severe depression   Psychosocial Evaluation and Intervention:  Psychosocial Evaluation - 02/02/24 1245       Psychosocial Evaluation & Interventions   Interventions Encouraged to exercise with the program and follow exercise prescription    Comments Ms. Desai is coming to Cardiac Rehab after a STEMI w/ stents. She required CPR and currently has a life vest on. She has been very active during her life and is ready to get back to hiking, biking and other outdoor activities. She states she has been healing well and feels thankful for all the support she has received from family and friends. She has no stress concerns at this time. She is very motivated to get started in the program and is already walking some. She wants to work towards hopefully going hiking during her trip to [place] that she has planned in June.    Expected Outcomes Short:  attend cardiac and pulmonary rehab. Long: develop and maintain positive self care habits.    Continue Psychosocial Services  Follow up required by staff             Psychosocial Re-Evaluation:   Psychosocial Discharge (Final Psychosocial Re-Evaluation):   Vocational Rehabilitation: Provide vocational rehab assistance to qualifying candidates.   Vocational Rehab Evaluation & Intervention:  Vocational Rehab - 02/02/24 1236       Initial Vocational Rehab Evaluation & Intervention   Assessment shows need for Vocational Rehabilitation No             Education: Education Goals: Education classes will be provided on a variety of topics geared toward better understanding of heart health and risk factor modification. Participant will state understanding/return demonstration of topics presented as noted by education test scores.  Learning Barriers/Preferences:  Learning Barriers/Preferences - 02/02/24 1236       Learning Barriers/Preferences   Learning Barriers None    Learning Preferences None             General Cardiac Education Topics:  AED/CPR: - Group verbal and written instruction with the use of models to demonstrate the basic use of the AED with the basic ABC's of resuscitation.   Anatomy and Cardiac Procedures: - Group verbal and visual presentation and models provide information about basic cardiac anatomy and function. Reviews the testing methods done to diagnose heart disease and the outcomes of the test results. Describes the treatment choices: Medical Management, Angioplasty, or Coronary Bypass Surgery for treating various heart conditions including Myocardial Infarction, Angina, Valve Disease, and Cardiac Arrhythmias.  Written material given at graduation. Flowsheet Row Cardiac Rehab from 02/07/2024 in [facility] Cardiac and Pulmonary Rehab  Education need identified 02/07/24       Medication Safety: - Group verbal and visual instruction to review  commonly prescribed medications for heart and lung  disease. Reviews the medication, class of the drug, and side effects. Includes the steps to properly store meds and maintain the prescription regimen.  Written material given at graduation.   Intimacy: - Group verbal instruction through game format to discuss how heart and lung disease can affect sexual intimacy. Written material given at graduation..   Know Your Numbers and Heart Failure: - Group verbal and visual instruction to discuss disease risk factors for cardiac and pulmonary disease and treatment options.  Reviews associated critical values for Overweight/Obesity, Hypertension, Cholesterol, and Diabetes.  Discusses basics of heart failure: signs/symptoms and treatments.  Introduces Heart Failure Zone chart for action plan for heart failure.  Written material given at graduation.   Infection Prevention: - Provides verbal and written material to individual with discussion of infection control including proper hand washing and proper equipment cleaning during exercise session. Flowsheet Row Cardiac Rehab from 02/07/2024 in [facility] Cardiac and Pulmonary Rehab  Date 02/07/24  Educator [facility]  Instruction Review Code 1- Verbalizes Understanding       Falls Prevention: - Provides verbal and written material to individual with discussion of falls prevention and safety. Flowsheet Row Cardiac Rehab from 02/07/2024 in [facility] Cardiac and Pulmonary Rehab  Date 02/07/24  Educator [facility]  Instruction Review Code 1- Verbalizes Understanding       Other: -Provides group and verbal instruction on various topics (see comments)   Knowledge Questionnaire Score:  Knowledge Questionnaire Score - 02/02/24 1254       Knowledge Questionnaire Score   Pre Score 23/26             Core Components/Risk Factors/Patient Goals at Admission:  Personal Goals and Risk Factors at Admission - 02/02/24 1234       Core Components/Risk Factors/Patient Goals on  Admission   Heart Failure Yes    Intervention Provide a combined exercise and nutrition program that is supplemented with education, support and counseling about heart failure. Directed toward relieving symptoms such as shortness of breath, decreased exercise tolerance, and extremity edema.    Expected Outcomes Improve functional capacity of life;Short term: Attendance in program 2-3 days a week with increased exercise capacity. Reported lower sodium intake. Reported increased fruit and vegetable intake. Reports medication compliance.;Short term: Daily weights obtained and reported for increase. Utilizing diuretic protocols set by physician.;Long term: Adoption of self-care skills and reduction of barriers for early signs and symptoms recognition and intervention leading to self-care maintenance.    Lipids Yes    Intervention Provide education and support for participant on nutrition & aerobic/resistive exercise along with prescribed medications to achieve LDL 70mg , HDL >40mg .    Expected Outcomes Short Term: Participant states understanding of desired cholesterol values and is compliant with medications prescribed. Participant is following exercise prescription and nutrition guidelines.;Long Term: Cholesterol controlled with medications as prescribed, with individualized exercise RX and with personalized nutrition plan. Value goals: LDL < 70mg , HDL > 40 mg.             Education:Diabetes - Individual verbal and written instruction to review signs/symptoms of diabetes, desired ranges of glucose level fasting, after meals and with exercise. Acknowledge that pre and post exercise glucose checks will be done for 3 sessions at entry of program.   Core Components/Risk Factors/Patient Goals Review:    Core Components/Risk Factors/Patient Goals at Discharge (Final Review):    ITP Comments:  ITP Comments     Row Name 02/02/24 1231 02/07/24 1532         ITP Comments  Initial phone call  completed. Diagnosis can be found in [facility] 2/25. EP Orientation scheduled for Tuesday 3/25 at 1:30. Completed and gym orientation. Initial ITP created and sent for review to Dr. Bethann Punches, Medical Director.               Comments: Initial ITP

## 2024-02-08 ENCOUNTER — Encounter: Admitting: *Deleted

## 2024-02-08 DIAGNOSIS — Z48812 Encounter for surgical aftercare following surgery on the circulatory system: Secondary | ICD-10-CM | POA: Diagnosis not present

## 2024-02-08 DIAGNOSIS — Z955 Presence of coronary angioplasty implant and graft: Secondary | ICD-10-CM

## 2024-02-08 DIAGNOSIS — I252 Old myocardial infarction: Secondary | ICD-10-CM | POA: Diagnosis not present

## 2024-02-08 DIAGNOSIS — I213 ST elevation (STEMI) myocardial infarction of unspecified site: Secondary | ICD-10-CM

## 2024-02-08 NOTE — Progress Notes (Signed)
 Daily Session Note  Patient Details  Name: Leslie Shepard MRN: [id number] Date of Birth: Jul 19, 1961 Referring Provider:   Flowsheet Row Cardiac Rehab from 02/07/2024 in [facility] Cardiac and Pulmonary Rehab  Referring Provider Dr. Cristal Deer End       Encounter Date: 02/08/2024  Check In:  Session Check In - 02/08/24 0945       Check-In   Supervising physician immediately available to respond to emergencies See telemetry face sheet for immediately available ER MD    Location [facility]-Cardiac & Pulmonary Rehab    Staff Present Ronette Deter, BS, Exercise Physiologist;Joseph Hollace Kinnier;Cora Collum, RN, BSN, CCRP;Jason Wallace Cullens RDN,LDN    Virtual Visit No    Medication changes reported     No    Fall or balance concerns reported    No    Warm-up and Cool-down Performed on first and last piece of equipment    Resistance Training Performed Yes    VAD Patient? No    PAD/SET Patient? No      Pain Assessment   Currently in Pain? No/denies                Social History   Tobacco Use  Smoking Status Never  Smokeless Tobacco Never    Goals Met:  Exercise tolerated well Personal goals reviewed No report of concerns or symptoms today  Goals Unmet:  Not Applicable  Comments: Pt able to follow exercise prescription today without complaint.  Will continue to monitor for progression. First full day of exercise!  Patient was oriented to gym and equipment including functions, settings, policies, and procedures.  Patient's individual exercise prescription and treatment plan were reviewed.  All starting workloads were established based on the results of the 6 minute walk test done at initial orientation visit.  The plan for exercise progression was also introduced and progression will be customized based on patient's performance and goals.    Dr. Bethann Punches is Medical Director for [facility] Cardiac Rehabilitation.  Dr. Vida Rigger is Medical Director for [facility] Pulmonary  Rehabilitation.

## 2024-02-10 ENCOUNTER — Encounter: Admitting: *Deleted

## 2024-02-10 ENCOUNTER — Ambulatory Visit (HOSPITAL_COMMUNITY)

## 2024-02-10 DIAGNOSIS — I252 Old myocardial infarction: Secondary | ICD-10-CM | POA: Diagnosis not present

## 2024-02-10 DIAGNOSIS — I213 ST elevation (STEMI) myocardial infarction of unspecified site: Secondary | ICD-10-CM

## 2024-02-10 DIAGNOSIS — Z955 Presence of coronary angioplasty implant and graft: Secondary | ICD-10-CM | POA: Diagnosis not present

## 2024-02-10 DIAGNOSIS — Z48812 Encounter for surgical aftercare following surgery on the circulatory system: Secondary | ICD-10-CM | POA: Diagnosis not present

## 2024-02-10 NOTE — Progress Notes (Signed)
 Daily Session Note  Patient Details  Name: Leslie Shepard MRN: [id number] Date of Birth: September 02, 1961 Referring Provider:   Flowsheet Row Cardiac Rehab from 02/07/2024 in [facility] Cardiac and Pulmonary Rehab  Referring Provider Dr. Cristal Deer End       Encounter Date: 02/10/2024  Check In:  Session Check In - 02/10/24 1134       Check-In   Supervising physician immediately available to respond to emergencies See telemetry face sheet for immediately available ER MD    Location [facility]-Cardiac & Pulmonary Rehab    Staff Present Cora Collum, RN, BSN, CCRP;Joseph Hood RCP,RRT,BSRT;Noah Tickle, [place], Exercise Physiologist    Virtual Visit No    Medication changes reported     Yes    Comments losartan 12.5 at bedtime    Fall or balance concerns reported    No    Warm-up and Cool-down Performed on first and last piece of equipment    Resistance Training Performed Yes    VAD Patient? No    PAD/SET Patient? No      Pain Assessment   Currently in Pain? No/denies                Social History   Tobacco Use  Smoking Status Never  Smokeless Tobacco Never    Goals Met:  Independence with exercise equipment Exercise tolerated well No report of concerns or symptoms today  Goals Unmet:  Not Applicable  Comments: Pt able to follow exercise prescription today without complaint.  Will continue to monitor for progression.    Dr. Bethann Punches is Medical Director for [facility] Cardiac Rehabilitation.  Dr. Vida Rigger is Medical Director for [facility] Pulmonary Rehabilitation.

## 2024-02-13 ENCOUNTER — Encounter: Admitting: *Deleted

## 2024-02-13 ENCOUNTER — Encounter: Payer: Self-pay | Admitting: Cardiology

## 2024-02-13 DIAGNOSIS — Z955 Presence of coronary angioplasty implant and graft: Secondary | ICD-10-CM | POA: Diagnosis not present

## 2024-02-13 DIAGNOSIS — Z48812 Encounter for surgical aftercare following surgery on the circulatory system: Secondary | ICD-10-CM | POA: Diagnosis not present

## 2024-02-13 DIAGNOSIS — I252 Old myocardial infarction: Secondary | ICD-10-CM | POA: Diagnosis not present

## 2024-02-13 DIAGNOSIS — I213 ST elevation (STEMI) myocardial infarction of unspecified site: Secondary | ICD-10-CM

## 2024-02-13 NOTE — Progress Notes (Signed)
 Daily Session Note  Patient Details  Name: Leslie Shepard MRN: [id number] Date of Birth: [birth year] Referring Provider:   Flowsheet Row Cardiac Rehab from 02/07/2024 in [facility] Cardiac and Pulmonary Rehab  Referring Provider Dr. Cristal Deer End       Encounter Date: 02/13/2024  Check In:  Session Check In - 02/13/24 0932       Check-In   Supervising physician immediately available to respond to emergencies See telemetry face sheet for immediately available ER MD    Location [facility]-Cardiac & Pulmonary Rehab    Staff Present Cora Collum, RN, BSN, CCRP;Margaret Best, MS, Exercise Physiologist;Kelly Madilyn Fireman BS, ACSM CEP, Exercise Physiologist;Maxon Conetta BS, Exercise Physiologist    Virtual Visit No    Medication changes reported     No    Fall or balance concerns reported    No    Warm-up and Cool-down Performed on first and last piece of equipment    Resistance Training Performed Yes    VAD Patient? No    PAD/SET Patient? No      Pain Assessment   Currently in Pain? No/denies                Social History   Tobacco Use  Smoking Status Never  Smokeless Tobacco Never    Goals Met:  Independence with exercise equipment Exercise tolerated well No report of concerns or symptoms today  Goals Unmet:  Not Applicable  Comments: Pt able to follow exercise prescription today without complaint.  Will continue to monitor for progression.    Dr. Bethann Punches is Medical Director for [facility] Cardiac Rehabilitation.  Dr. Vida Rigger is Medical Director for [facility] Pulmonary Rehabilitation.

## 2024-02-14 DIAGNOSIS — I213 ST elevation (STEMI) myocardial infarction of unspecified site: Secondary | ICD-10-CM | POA: Diagnosis not present

## 2024-02-15 ENCOUNTER — Encounter: Attending: Internal Medicine | Admitting: *Deleted

## 2024-02-15 DIAGNOSIS — Z48812 Encounter for surgical aftercare following surgery on the circulatory system: Secondary | ICD-10-CM | POA: Insufficient documentation

## 2024-02-15 DIAGNOSIS — Z955 Presence of coronary angioplasty implant and graft: Secondary | ICD-10-CM | POA: Insufficient documentation

## 2024-02-15 DIAGNOSIS — I213 ST elevation (STEMI) myocardial infarction of unspecified site: Secondary | ICD-10-CM | POA: Diagnosis present

## 2024-02-15 DIAGNOSIS — I252 Old myocardial infarction: Secondary | ICD-10-CM | POA: Insufficient documentation

## 2024-02-15 NOTE — Progress Notes (Signed)
 Daily Session Note  Patient Details  Name: Leslie Shepard MRN: [id number] Date of Birth: [birth year] Referring Provider:   Flowsheet Row Cardiac Rehab from 02/07/2024 in [facility] Cardiac and Pulmonary Rehab  Referring Provider Dr. Cristal Deer End       Encounter Date: 02/15/2024  Check In:  Session Check In - 02/15/24 0930       Check-In   Supervising physician immediately available to respond to emergencies See telemetry face sheet for immediately available ER MD    Location [facility]-Cardiac & Pulmonary Rehab    Staff Present Cora Collum, RN, BSN, CCRP;Joseph Hood RCP,RRT,BSRT;Noah Tickle, [place], Exercise Physiologist;Maxon Conetta BS, Exercise Physiologist    Virtual Visit No    Medication changes reported     No    Fall or balance concerns reported    No    Warm-up and Cool-down Performed on first and last piece of equipment    Resistance Training Performed Yes    VAD Patient? No    PAD/SET Patient? No      Pain Assessment   Currently in Pain? No/denies                Social History   Tobacco Use  Smoking Status Never  Smokeless Tobacco Never    Goals Met:  Independence with exercise equipment Exercise tolerated well No report of concerns or symptoms today  Goals Unmet:  Not Applicable  Comments: Pt able to follow exercise prescription today without complaint.  Will continue to monitor for progression.    Dr. Bethann Punches is Medical Director for [facility] Cardiac Rehabilitation.  Dr. Vida Rigger is Medical Director for [facility] Pulmonary Rehabilitation.

## 2024-02-17 ENCOUNTER — Encounter: Admitting: *Deleted

## 2024-02-17 DIAGNOSIS — Z955 Presence of coronary angioplasty implant and graft: Secondary | ICD-10-CM

## 2024-02-17 DIAGNOSIS — I213 ST elevation (STEMI) myocardial infarction of unspecified site: Secondary | ICD-10-CM

## 2024-02-17 DIAGNOSIS — Z48812 Encounter for surgical aftercare following surgery on the circulatory system: Secondary | ICD-10-CM | POA: Diagnosis not present

## 2024-02-17 NOTE — Progress Notes (Signed)
 Daily Session Note  Patient Details  Name: Leslie Shepard MRN: [id number] Date of Birth: 1961-01-13 Referring Provider:   Flowsheet Row Cardiac Rehab from 02/07/2024 in [facility] Cardiac and Pulmonary Rehab  Referring Provider Dr. Cristal Deer End       Encounter Date: 02/17/2024  Check In:  Session Check In - 02/17/24 1105       Check-In   Supervising physician immediately available to respond to emergencies See telemetry face sheet for immediately available ER MD    Location [facility]-Cardiac & Pulmonary Rehab    Staff Present Rory Percy, MS, Exercise Physiologist;Maxon Conetta BS, Exercise Physiologist;Noah Tickle, BS, Exercise Physiologist;Monae Topping, RN, BSN, CCRP    Virtual Visit No    Medication changes reported     No    Fall or balance concerns reported    No    Warm-up and Cool-down Performed on first and last piece of equipment    Resistance Training Performed Yes    VAD Patient? No    PAD/SET Patient? No      Pain Assessment   Currently in Pain? No/denies                Social History   Tobacco Use  Smoking Status Never  Smokeless Tobacco Never    Goals Met:  Independence with exercise equipment Exercise tolerated well No report of concerns or symptoms today  Goals Unmet:  Not Applicable  Comments: Pt able to follow exercise prescription today without complaint.  Will continue to monitor for progression.    Dr. Bethann Punches is Medical Director for [facility] Cardiac Rehabilitation.  Dr. Vida Rigger is Medical Director for [facility] Pulmonary Rehabilitation.

## 2024-02-20 ENCOUNTER — Encounter: Admitting: *Deleted

## 2024-02-20 DIAGNOSIS — Z48812 Encounter for surgical aftercare following surgery on the circulatory system: Secondary | ICD-10-CM | POA: Diagnosis not present

## 2024-02-20 DIAGNOSIS — I213 ST elevation (STEMI) myocardial infarction of unspecified site: Secondary | ICD-10-CM

## 2024-02-20 DIAGNOSIS — Z955 Presence of coronary angioplasty implant and graft: Secondary | ICD-10-CM

## 2024-02-20 NOTE — Progress Notes (Signed)
 Daily Session Note  Patient Details  Name: Leslie Shepard MRN: [id number] Date of Birth: [birth year] Referring Provider:   Flowsheet Row Cardiac Rehab from 02/07/2024 in [facility] Cardiac and Pulmonary Rehab  Referring Provider Dr. Cristal Deer End       Encounter Date: 02/20/2024  Check In:  Session Check In - 02/20/24 0937       Check-In   Supervising physician immediately available to respond to emergencies See telemetry face sheet for immediately available ER MD    Location [facility]-Cardiac & Pulmonary Rehab    Staff Present Cora Collum, RN, BSN, CCRP;Kelly Hayes BS, ACSM CEP, Exercise Physiologist;Maxon Conetta BS, Exercise Physiologist;[employer]    Virtual Visit No    Medication changes reported     No    Fall or balance concerns reported    No    Warm-up and Cool-down Performed on first and last piece of equipment    Resistance Training Performed Yes    VAD Patient? No    PAD/SET Patient? No      Pain Assessment   Currently in Pain? No/denies                Social History   Tobacco Use  Smoking Status Never  Smokeless Tobacco Never    Goals Met:  Independence with exercise equipment Exercise tolerated well No report of concerns or symptoms today  Goals Unmet:  Not Applicable  Comments: Pt able to follow exercise prescription today without complaint.  Will continue to monitor for progression.    Dr. Bethann Punches is Medical Director for [facility] Cardiac Rehabilitation.  Dr. Vida Rigger is Medical Director for [facility] Pulmonary Rehabilitation.

## 2024-02-22 ENCOUNTER — Encounter

## 2024-02-22 ENCOUNTER — Encounter: Payer: Self-pay | Admitting: *Deleted

## 2024-02-22 DIAGNOSIS — I213 ST elevation (STEMI) myocardial infarction of unspecified site: Secondary | ICD-10-CM

## 2024-02-22 DIAGNOSIS — Z955 Presence of coronary angioplasty implant and graft: Secondary | ICD-10-CM

## 2024-02-22 DIAGNOSIS — Z48812 Encounter for surgical aftercare following surgery on the circulatory system: Secondary | ICD-10-CM | POA: Diagnosis not present

## 2024-02-22 NOTE — Progress Notes (Signed)
 Cardiac Individual Treatment Plan  Patient Details  Name: Leslie Shepard MRN: [id number] Date of Birth: [birth year] Referring Provider:   Flowsheet Row Cardiac Rehab from 02/07/2024 in [facility] Cardiac and Pulmonary Rehab  Referring Provider Dr. Cristal Deer End       Initial Encounter Date:  Flowsheet Row Cardiac Rehab from 02/07/2024 in [facility] Cardiac and Pulmonary Rehab  Date 02/07/24       Visit Diagnosis: ST elevation myocardial infarction (STEMI), unspecified artery [facility])  Status post coronary artery stent placement  Patient's Home Medications on Admission:  Current Outpatient Medications:    amiodarone (PACERONE) 200 MG tablet, Take 1 tablet (200 mg total) by mouth 2 (two) times daily for 12 days, THEN 1 tablet (200 mg total) daily., Disp: 114 tablet, Rfl: 0   aspirin 81 MG chewable tablet, Chew 1 tablet (81 mg total) by mouth daily., Disp: 90 tablet, Rfl: 1   empagliflozin (JARDIANCE) 10 MG TABS tablet, Take 1 tablet (10 mg total) by mouth daily., Disp: 30 tablet, Rfl: 2   Evolocumab (REPATHA SURECLICK) 140 MG/ML SOAJ, Inject 140 mg into the skin every 14 (fourteen) days., Disp: 2 mL, Rfl: 11   ezetimibe (ZETIA) 10 MG tablet, Take 1 tablet (10 mg total) by mouth daily., Disp: 90 tablet, Rfl: 1   losartan (COZAAR) 25 MG tablet, Take 0.5 tablets (12.5 mg total) by mouth every evening., Disp: 45 tablet, Rfl: 3   metoprolol succinate (TOPROL-XL) 25 MG 24 hr tablet, Take 0.5 tablets (12.5 mg total) by mouth daily., Disp: 30 tablet, Rfl: 2   mexiletine (MEXITIL) 150 MG capsule, Take 1 capsule (150 mg total) by mouth every 12 (twelve) hours., Disp: 60 capsule, Rfl: 2   oxyCODONE (OXY IR/ROXICODONE) 5 MG immediate release tablet, Take 1 tablet (5 mg total) by mouth every 6 (six) hours as needed for moderate pain (pain score 4-6). (Patient not taking: Reported on 02/02/2024), Disp: 30 tablet, Rfl: 0   polyethylene glycol (MIRALAX / GLYCOLAX) 17 g packet, Take 17 g by mouth daily. (Patient not  taking: Reported on 02/02/2024), Disp: 14 each, Rfl: 0   prasugrel (EFFIENT) 5 MG TABS tablet, Take 1 tablet (5 mg total) by mouth daily., Disp: 30 tablet, Rfl: 2   rosuvastatin (CRESTOR) 10 MG tablet, Take 1 tablet (10 mg total) by mouth daily. In evening with a low fat snack (Patient not taking: Reported on 02/02/2024), Disp: 30 tablet, Rfl: 1  Past Medical History: Past Medical History:  Diagnosis Date   Hyperlipidemia    PMDD (premenstrual dysphoric disorder)     Tobacco Use: Social History   Tobacco Use  Smoking Status Never  Smokeless Tobacco Never    Labs: Review Flowsheet  More data exists      Latest Ref Rng & Units 12/24/2009 02/16/2013 01/17/2019 10/04/2023 01/11/2024  Labs for ITP Cardiac and Pulmonary Rehab  Cholestrol 0 - 200 mg/dL 664  403  474  259  563   LDL (calc) 0 - 99 mg/dL 875  - 643  329  518   Direct LDL mg/dL - 841.6  - - -  HDL-C >60 mg/dL 63.01  60.10  93.23  55.73  60   Trlycerides <150 mg/dL 22.0  254.2  706.2  376.2  68      Exercise Target Goals: Exercise Program Goal: Individual exercise prescription set using results from initial 6 min walk test and THRR while considering  patient's activity barriers and safety.   Exercise Prescription Goal: Initial exercise prescription builds to  30-45 minutes a day of aerobic activity, 2-3 days per week.  Home exercise guidelines will be given to patient during program as part of exercise prescription that the participant will acknowledge.   Education: Aerobic Exercise: - Group verbal and visual presentation on the components of exercise prescription. Introduces F.I.T.T principle from ACSM for exercise prescriptions.  Reviews F.I.T.T. principles of aerobic exercise including progression. Written material given at graduation. Flowsheet Row Cardiac Rehab from 02/15/2024 in [facility] Cardiac and Pulmonary Rehab  Education need identified 02/07/24       Education: Resistance Exercise: - Group verbal and visual  presentation on the components of exercise prescription. Introduces F.I.T.T principle from ACSM for exercise prescriptions  Reviews F.I.T.T. principles of resistance exercise including progression. Written material given at graduation.    Education: Exercise & Equipment Safety: - Individual verbal instruction and demonstration of equipment use and safety with use of the equipment. Flowsheet Row Cardiac Rehab from 02/15/2024 in [facility] Cardiac and Pulmonary Rehab  Date 02/07/24  Educator [facility]  Instruction Review Code 1- Verbalizes Understanding       Education: Exercise Physiology & General Exercise Guidelines: - Group verbal and written instruction with models to review the exercise physiology of the cardiovascular system and associated critical values. Provides general exercise guidelines with specific guidelines to those with heart or lung disease.    Education: Flexibility, Balance, Mind/Body Relaxation: - Group verbal and visual presentation with interactive activity on the components of exercise prescription. Introduces F.I.T.T principle from ACSM for exercise prescriptions. Reviews F.I.T.T. principles of flexibility and balance exercise training including progression. Also discusses the mind body connection.  Reviews various relaxation techniques to help reduce and manage stress (i.e. Deep breathing, progressive muscle relaxation, and visualization). Balance handout provided to take home. Written material given at graduation.   Activity Barriers & Risk Stratification:  Activity Barriers & Cardiac Risk Stratification - 02/07/24 1533       Activity Barriers & Cardiac Risk Stratification   Activity Barriers None    Cardiac Risk Stratification High             6 Minute Walk:  6 Minute Walk     Row Name 02/07/24 1532         6 Minute Walk   Phase Initial     Distance 1620 feet     Walk Time 6 minutes     # of Rest Breaks 0     MPH 3.07     METS 4.1     RPE 9     Perceived  Dyspnea  0     VO2 Peak 14.4     Symptoms No     Resting HR 63 bpm     Resting BP 118/64     Resting Oxygen Saturation  97 %     Exercise Oxygen Saturation  during 6 min walk 99 %     Max Ex. HR 96 bpm     Max Ex. BP 138/68     2 Minute Post BP 122/66              Oxygen Initial Assessment:   Oxygen Re-Evaluation:   Oxygen Discharge (Final Oxygen Re-Evaluation):   Initial Exercise Prescription:  Initial Exercise Prescription - 02/07/24 1500       Date of Initial Exercise RX and Referring Provider   Date 02/07/24    Referring Provider Dr. Cristal Deer End      Oxygen   Maintain Oxygen Saturation 88% or higher  Treadmill   MPH 3    Grade 2    Minutes 15    METs 4.1      Elliptical   Level 1    Speed 3    Minutes 15    METs 4.1      REL-XR   Level 4    Watts 25    Speed 50    Minutes 15    METs 4.1      Intensity   THRR 40-80% of Max Heartrate 101-139    Ratings of Perceived Exertion 11-13    Perceived Dyspnea 0-4      Resistance Training   Training Prescription Yes    Weight 4lb    Reps 10-15             Perform Capillary Blood Glucose checks as needed.  Exercise Prescription Changes:   Exercise Prescription Changes     Row Name 02/07/24 1500 02/08/24 0900 02/16/24 0800         Response to Exercise   Blood Pressure (Admit) 118/64 118/64 102/64     Blood Pressure (Exercise) 138/68 138/68 138/58     Blood Pressure (Exit) 122/66 122/66 104/56     Heart Rate (Admit) 63 bpm 63 bpm 53 bpm     Heart Rate (Exercise) 96 bpm 96 bpm 114 bpm     Heart Rate (Exit) 59 bpm 59 bpm 70 bpm     Oxygen Saturation (Admit) 97 % 97 % --     Oxygen Saturation (Exercise) 99 % 99 % --     Oxygen Saturation (Exit) 99 % 99 % --     Rating of Perceived Exertion (Exercise) 9 9 13      Perceived Dyspnea (Exercise) 0 0 --     Symptoms none none none     Comments results results First 2 exercise sessions     Duration -- -- Continue with 30 min  of aerobic exercise without signs/symptoms of physical distress.     Intensity -- -- THRR unchanged       Progression   Progression -- -- Continue to progress workloads to maintain intensity without signs/symptoms of physical distress.     Average METs -- -- 4.01       Resistance Training   Training Prescription -- Yes Yes     Weight -- 4lb 4lb     Reps -- 10-15 10-15       Interval Training   Interval Training -- -- No       Treadmill   MPH -- 3 3     Grade -- 2 2     Minutes -- 15 15     METs -- 4.1 4.12       Elliptical   Level -- 1 2     Speed -- 3 3.5     Minutes -- 15 15     METs -- 4.1 3.8       REL-XR   Level -- 4 4     Watts -- 25 --     Speed -- 50 --     Minutes -- 15 15     METs -- 4.1 --       Oxygen   Maintain Oxygen Saturation -- -- 88% or higher              Exercise Comments:   Exercise Comments     Row Name 02/08/24 [phone]  Exercise Comments First full day of exercise!  Patient was oriented to gym and equipment including functions, settings, policies, and procedures.  Patient's individual exercise prescription and treatment plan were reviewed.  All starting workloads were established based on the results of the 6 minute walk test done at initial orientation visit.  The plan for exercise progression was also introduced and progression will be customized based on patient's performance and goals.                Exercise Goals and Review:   Exercise Goals     Row Name 02/07/24 1536             Exercise Goals   Increase Physical Activity Yes       Intervention Provide advice, education, support and counseling about physical activity/exercise needs.;Develop an individualized exercise prescription for aerobic and resistive training based on initial evaluation findings, risk stratification, comorbidities and participant's personal goals.       Expected Outcomes Short Term: Attend rehab on a regular basis to increase amount of  physical activity.;Long Term: Exercising regularly at least 3-5 days a week.;Long Term: Add in home exercise to make exercise part of routine and to increase amount of physical activity.       Increase Strength and Stamina Yes       Intervention Develop an individualized exercise prescription for aerobic and resistive training based on initial evaluation findings, risk stratification, comorbidities and participant's personal goals.;Provide advice, education, support and counseling about physical activity/exercise needs.       Expected Outcomes Long Term: Improve cardiorespiratory fitness, muscular endurance and strength as measured by increased METs and functional capacity ( );Short Term: Perform resistance training exercises routinely during rehab and add in resistance training at home;Short Term: Increase workloads from initial exercise prescription for resistance, speed, and METs.       Able to understand and use rate of perceived exertion (RPE) scale Yes       Intervention Provide education and explanation on how to use RPE scale       Expected Outcomes Long Term:  Able to use RPE to guide intensity level when exercising independently;Short Term: Able to use RPE daily in rehab to express subjective intensity level       Able to understand and use Dyspnea scale Yes       Intervention Provide education and explanation on how to use Dyspnea scale       Expected Outcomes Short Term: Able to use Dyspnea scale daily in rehab to express subjective sense of shortness of breath during exertion;Long Term: Able to use Dyspnea scale to guide intensity level when exercising independently       Knowledge and understanding of Target Heart Rate Range (THRR) Yes       Intervention Provide education and explanation of THRR including how the numbers were predicted and where they are located for reference       Expected Outcomes Long Term: Able to use THRR to govern intensity when exercising independently;Short Term:  Able to use daily as guideline for intensity in rehab;Short Term: Able to state/look up THRR       Able to check pulse independently Yes       Intervention Review the importance of being able to check your own pulse for safety during independent exercise;Provide education and demonstration on how to check pulse in carotid and radial arteries.       Expected Outcomes Long Term: Able to check pulse independently and accurately;Short  Term: Able to explain why pulse checking is important during independent exercise       Understanding of Exercise Prescription Yes       Intervention Provide education, explanation, and written materials on patient's individual exercise prescription       Expected Outcomes Long Term: Able to explain home exercise prescription to exercise independently;Short Term: Able to explain program exercise prescription                Exercise Goals Re-Evaluation :  Exercise Goals Re-Evaluation     Row Name 02/08/24 0946 02/16/24 0813           Exercise Goal Re-Evaluation   Exercise Goals Review Able to understand and use rate of perceived exertion (RPE) scale;Able to understand and use Dyspnea scale;Knowledge and understanding of Target Heart Rate Range (THRR);Understanding of Exercise Prescription Increase Physical Activity;Understanding of Exercise Prescription;Increase Strength and Stamina      Comments Reviewed RPE and dyspnea scale, THR and program prescription with pt today.  Pt voiced understanding and was given a copy of goals to take home. Elka is off to a good start in the program. She has tolerated her exercise prescription very well. She did well with her treadmill workload of a speed of 3 mph and incline of 2%. She also did well with level 4 on the XR. She already increased her workload on the elliptical at level 2 with a speed of 3.5 mph. We will continue to monitor her progress in the program.      Expected Outcomes Short: Use RPE daily to regulate intensity.  Long: Follow program prescription in THR. Short: Continue to progressively increase workloads when appropriate. Long: Continue exercise to improve strength and stamina.               Discharge Exercise Prescription (Final Exercise Prescription Changes):  Exercise Prescription Changes - 02/16/24 0800       Response to Exercise   Blood Pressure (Admit) 102/64    Blood Pressure (Exercise) 138/58    Blood Pressure (Exit) 104/56    Heart Rate (Admit) 53 bpm    Heart Rate (Exercise) 114 bpm    Heart Rate (Exit) 70 bpm    Rating of Perceived Exertion (Exercise) 13    Symptoms none    Comments First 2 exercise sessions    Duration Continue with 30 min of aerobic exercise without signs/symptoms of physical distress.    Intensity THRR unchanged      Progression   Progression Continue to progress workloads to maintain intensity without signs/symptoms of physical distress.    Average METs 4.01      Resistance Training   Training Prescription Yes    Weight 4lb    Reps 10-15      Interval Training   Interval Training No      Treadmill   MPH 3    Grade 2    Minutes 15    METs 4.12      Elliptical   Level 2    Speed 3.5    Minutes 15    METs 3.8      REL-XR   Level 4    Minutes 15      Oxygen   Maintain Oxygen Saturation 88% or higher             Nutrition:  Target Goals: Understanding of nutrition guidelines, daily intake of sodium 1500mg , cholesterol 200mg , calories 30% from fat and 7% or less from saturated fats, daily  to have 5 or more servings of fruits and vegetables.  Education: All About Nutrition: -Group instruction provided by verbal, written material, interactive activities, discussions, models, and posters to present general guidelines for heart healthy nutrition including fat, fiber, MyPlate, the role of sodium in heart healthy nutrition, utilization of the nutrition label, and utilization of this knowledge for meal planning. Follow up email sent as  well. Written material given at graduation. Flowsheet Row Cardiac Rehab from 02/15/2024 in [facility] Cardiac and Pulmonary Rehab  Date 02/15/24  Educator JG part 2  Instruction Review Code 1- Verbalizes Understanding       Biometrics:  Pre Biometrics - 02/07/24 1536       Pre Biometrics   Height 5' 3.94" (1.624 m)    Weight 130 lb 11.2 oz (59.3 kg)    Waist Circumference 28 inches    Hip Circumference 35 inches    Waist to Hip Ratio 0.8 %    BMI (Calculated) 22.48    Single Leg Stand 30 seconds              Nutrition Therapy Plan and Nutrition Goals:   Nutrition Assessments:  MEDIFICTS Score Key: >=70 Need to make dietary changes  40-70 Heart Healthy Diet <= 40 Therapeutic Level Cholesterol Diet  Flowsheet Row Cardiac Rehab from 02/02/2024 in [facility] Cardiac and Pulmonary Rehab  Picture Your Plate Total Score on Admission 83      Picture Your Plate Scores: <16 Unhealthy dietary pattern with much room for improvement. 41-50 Dietary pattern unlikely to meet recommendations for good health and room for improvement. 51-60 More healthful dietary pattern, with some room for improvement.  >60 Healthy dietary pattern, although there may be some specific behaviors that could be improved.    Nutrition Goals Re-Evaluation:   Nutrition Goals Discharge (Final Nutrition Goals Re-Evaluation):   Psychosocial: Target Goals: Acknowledge presence or absence of significant depression and/or stress, maximize coping skills, provide positive support system. Participant is able to verbalize types and ability to use techniques and skills needed for reducing stress and depression.   Education: Stress, Anxiety, and Depression - Group verbal and visual presentation to define topics covered.  Reviews how body is impacted by stress, anxiety, and depression.  Also discusses healthy ways to reduce stress and to treat/manage anxiety and depression.  Written material given at  graduation.   Education: Sleep Hygiene -Provides group verbal and written instruction about how sleep can affect your health.  Define sleep hygiene, discuss sleep cycles and impact of sleep habits. Review good sleep hygiene tips.    Initial Review & Psychosocial Screening:  Initial Psych Review & Screening - 02/02/24 1237       Initial Review   Current issues with None Identified      Family Dynamics   Good Support System? Yes   family. friends     Barriers   Psychosocial barriers to participate in program There are no identifiable barriers or psychosocial needs.      Screening Interventions   Interventions Encouraged to exercise;Provide feedback about the scores to participant;To provide support and resources with identified psychosocial needs    Expected Outcomes Short Term goal: Utilizing psychosocial counselor, staff and physician to assist with identification of specific Stressors or current issues interfering with healing process. Setting desired goal for each stressor or current issue identified.;Long Term Goal: Stressors or current issues are controlled or eliminated.;Short Term goal: Identification and review with participant of any Quality of Life or Depression concerns found by  scoring the questionnaire.;Long Term goal: The participant improves quality of Life and PHQ9 Scores as seen by post scores and/or verbalization of changes             Quality of Life Scores:   Scores of 19 and below usually indicate a poorer quality of life in these areas.  A difference of  2-3 points is a clinically meaningful difference.  A difference of 2-3 points in the total score of the Quality of Life Index has been associated with significant improvement in overall quality of life, self-image, physical symptoms, and general health in studies assessing change in quality of life.  PHQ-9: Review Flowsheet       02/07/2024 10/11/2023 03/22/2023 01/17/2019  Depression screen PHQ 2/9  Decreased  Interest 0 0 0 0  Down, Depressed, Hopeless 0 0 0 0  PHQ - 2 Score 0 0 0 0  Altered sleeping 1 0 0 -  Tired, decreased energy 1 0 0 -  Change in appetite 0 0 0 -  Feeling bad or failure about yourself  0 0 0 -  Trouble concentrating 0 0 0 -  Moving slowly or fidgety/restless 0 0 0 -  Suicidal thoughts 0 0 0 -  PHQ-9 Score 2 0 0 -  Difficult doing work/chores Not difficult at all Not difficult at all Not difficult at all -   Interpretation of Total Score  Total Score Depression Severity:  1-4 = Minimal depression, 5-9 = Mild depression, 10-14 = Moderate depression, 15-19 = Moderately severe depression, 20-27 = Severe depression   Psychosocial Evaluation and Intervention:  Psychosocial Evaluation - 02/02/24 1245       Psychosocial Evaluation & Interventions   Interventions Encouraged to exercise with the program and follow exercise prescription    Comments Ms. Malson is coming to Cardiac Rehab after a STEMI w/ stents. She required CPR and currently has a life vest on. She has been very active during her life and is ready to get back to hiking, biking and other outdoor activities. She states she has been healing well and feels thankful for all the support she has received from family and friends. She has no stress concerns at this time. She is very motivated to get started in the program and is already walking some. She wants to work towards hopefully going hiking during her trip to [place] that she has planned in June.    Expected Outcomes Short: attend cardiac and pulmonary rehab. Long: develop and maintain positive self care habits.    Continue Psychosocial Services  Follow up required by staff             Psychosocial Re-Evaluation:   Psychosocial Discharge (Final Psychosocial Re-Evaluation):   Vocational Rehabilitation: Provide vocational rehab assistance to qualifying candidates.   Vocational Rehab Evaluation & Intervention:  Vocational Rehab - 02/02/24 1236        Initial Vocational Rehab Evaluation & Intervention   Assessment shows need for Vocational Rehabilitation No             Education: Education Goals: Education classes will be provided on a variety of topics geared toward better understanding of heart health and risk factor modification. Participant will state understanding/return demonstration of topics presented as noted by education test scores.  Learning Barriers/Preferences:  Learning Barriers/Preferences - 02/02/24 1236       Learning Barriers/Preferences   Learning Barriers None    Learning Preferences None  General Cardiac Education Topics:  AED/CPR: - Group verbal and written instruction with the use of models to demonstrate the basic use of the AED with the basic ABC's of resuscitation.   Anatomy and Cardiac Procedures: - Group verbal and visual presentation and models provide information about basic cardiac anatomy and function. Reviews the testing methods done to diagnose heart disease and the outcomes of the test results. Describes the treatment choices: Medical Management, Angioplasty, or Coronary Bypass Surgery for treating various heart conditions including Myocardial Infarction, Angina, Valve Disease, and Cardiac Arrhythmias.  Written material given at graduation. Flowsheet Row Cardiac Rehab from 02/15/2024 in [facility] Cardiac and Pulmonary Rehab  Education need identified 02/07/24       Medication Safety: - Group verbal and visual instruction to review commonly prescribed medications for heart and lung disease. Reviews the medication, class of the drug, and side effects. Includes the steps to properly store meds and maintain the prescription regimen.  Written material given at graduation.   Intimacy: - Group verbal instruction through game format to discuss how heart and lung disease can affect sexual intimacy. Written material given at graduation..   Know Your Numbers and Heart Failure: -  Group verbal and visual instruction to discuss disease risk factors for cardiac and pulmonary disease and treatment options.  Reviews associated critical values for Overweight/Obesity, Hypertension, Cholesterol, and Diabetes.  Discusses basics of heart failure: signs/symptoms and treatments.  Introduces Heart Failure Zone chart for action plan for heart failure.  Written material given at graduation.   Infection Prevention: - Provides verbal and written material to individual with discussion of infection control including proper hand washing and proper equipment cleaning during exercise session. Flowsheet Row Cardiac Rehab from 02/15/2024 in [facility] Cardiac and Pulmonary Rehab  Date 02/07/24  Educator [facility]  Instruction Review Code 1- Verbalizes Understanding       Falls Prevention: - Provides verbal and written material to individual with discussion of falls prevention and safety. Flowsheet Row Cardiac Rehab from 02/15/2024 in [facility] Cardiac and Pulmonary Rehab  Date 02/07/24  Educator [facility]  Instruction Review Code 1- Verbalizes Understanding       Other: -Provides group and verbal instruction on various topics (see comments)   Knowledge Questionnaire Score:  Knowledge Questionnaire Score - 02/02/24 1254       Knowledge Questionnaire Score   Pre Score 23/26             Core Components/Risk Factors/Patient Goals at Admission:  Personal Goals and Risk Factors at Admission - 02/02/24 1234       Core Components/Risk Factors/Patient Goals on Admission   Heart Failure Yes    Intervention Provide a combined exercise and nutrition program that is supplemented with education, support and counseling about heart failure. Directed toward relieving symptoms such as shortness of breath, decreased exercise tolerance, and extremity edema.    Expected Outcomes Improve functional capacity of life;Short term: Attendance in program 2-3 days a week with increased exercise capacity. Reported lower sodium  intake. Reported increased fruit and vegetable intake. Reports medication compliance.;Short term: Daily weights obtained and reported for increase. Utilizing diuretic protocols set by physician.;Long term: Adoption of self-care skills and reduction of barriers for early signs and symptoms recognition and intervention leading to self-care maintenance.    Lipids Yes    Intervention Provide education and support for participant on nutrition & aerobic/resistive exercise along with prescribed medications to achieve LDL 70mg , HDL >40mg .    Expected Outcomes Short Term: Participant states understanding of  desired cholesterol values and is compliant with medications prescribed. Participant is following exercise prescription and nutrition guidelines.;Long Term: Cholesterol controlled with medications as prescribed, with individualized exercise RX and with personalized nutrition plan. Value goals: LDL < 70mg , HDL > 40 mg.             Education:Diabetes - Individual verbal and written instruction to review signs/symptoms of diabetes, desired ranges of glucose level fasting, after meals and with exercise. Acknowledge that pre and post exercise glucose checks will be done for 3 sessions at entry of program.   Core Components/Risk Factors/Patient Goals Review:    Core Components/Risk Factors/Patient Goals at Discharge (Final Review):    ITP Comments:  ITP Comments     Row Name 02/02/24 1231 02/07/24 1532 02/08/24 0946 02/22/24 0946     ITP Comments Initial phone call completed. Diagnosis can be found in [facility] 2/25. EP Orientation scheduled for Tuesday 3/25 at 1:30. Completed and gym orientation. Initial ITP created and sent for review to Dr. Bethann Punches, Medical Director. First full day of exercise!  Patient was oriented to gym and equipment including functions, settings, policies, and procedures.  Patient's individual exercise prescription and treatment plan were reviewed.  All starting workloads  were established based on the results of the 6 minute walk test done at initial orientation visit.  The plan for exercise progression was also introduced and progression will be customized based on patient's performance and goals. 30 Day review completed. Medical Director ITP review done, changes made as directed, and signed approval by Medical Director.    new to program             Comments:

## 2024-02-22 NOTE — Progress Notes (Signed)
 Daily Session Note  Patient Details  Name: Leslie Shepard MRN: [id number] Date of Birth: Nov 21, 1960 Referring Provider:   Flowsheet Row Cardiac Rehab from 02/07/2024 in [facility] Cardiac and Pulmonary Rehab  Referring Provider Dr. Cristal Deer End       Encounter Date: 02/22/2024  Check In:  Session Check In - 02/22/24 0952       Check-In   Supervising physician immediately available to respond to emergencies See telemetry face sheet for immediately available ER MD    Location [facility]-Cardiac & Pulmonary Rehab    Staff Present Kelton Pillar RN,BSN,MPA;Joseph Hood RCP,RRT,BSRT;Maxon Manya Silvas BS, Exercise Physiologist;Margaret Best, MS, Exercise Physiologist    Virtual Visit No    Medication changes reported     No    Fall or balance concerns reported    No    Tobacco Cessation No Change    Warm-up and Cool-down Performed on first and last piece of equipment    Resistance Training Performed Yes    VAD Patient? No    PAD/SET Patient? No      Pain Assessment   Currently in Pain? No/denies                Social History   Tobacco Use  Smoking Status Never  Smokeless Tobacco Never    Goals Met:  Independence with exercise equipment Exercise tolerated well No report of concerns or symptoms today Strength training completed today  Goals Unmet:  Not Applicable  Comments: Pt able to follow exercise prescription today without complaint.  Will continue to monitor for progression.    Dr. Bethann Punches is Medical Director for [facility] Cardiac Rehabilitation.  Dr. Vida Rigger is Medical Director for [facility] Pulmonary Rehabilitation.

## 2024-02-24 ENCOUNTER — Encounter: Admitting: *Deleted

## 2024-02-24 DIAGNOSIS — I213 ST elevation (STEMI) myocardial infarction of unspecified site: Secondary | ICD-10-CM | POA: Diagnosis not present

## 2024-02-24 DIAGNOSIS — Z48812 Encounter for surgical aftercare following surgery on the circulatory system: Secondary | ICD-10-CM | POA: Diagnosis not present

## 2024-02-24 DIAGNOSIS — Z955 Presence of coronary angioplasty implant and graft: Secondary | ICD-10-CM

## 2024-02-24 DIAGNOSIS — Z79899 Other long term (current) drug therapy: Secondary | ICD-10-CM | POA: Diagnosis not present

## 2024-02-24 NOTE — Progress Notes (Signed)
 Daily Session Note  Patient Details  Name: Leslie Shepard MRN: [id number] Date of Birth: 23-Oct-1961 Referring Provider:   Flowsheet Row Cardiac Rehab from 02/07/2024 in [facility] Cardiac and Pulmonary Rehab  Referring Provider Dr. Cristal Deer End       Encounter Date: 02/24/2024  Check In:  Session Check In - 02/24/24 1114       Check-In   Supervising physician immediately available to respond to emergencies See telemetry face sheet for immediately available ER MD    Location [facility]-Cardiac & Pulmonary Rehab    Staff Present Maxon Conetta BS, Exercise Physiologist;Noah Tickle, BS, Exercise Physiologist;Nehemie Casserly, RN, BSN, CCRP;Joseph Hood RCP,RRT,BSRT    Virtual Visit No    Medication changes reported     No    Fall or balance concerns reported    No    Warm-up and Cool-down Performed on first and last piece of equipment    Resistance Training Performed Yes    VAD Patient? No    PAD/SET Patient? No      Pain Assessment   Currently in Pain? No/denies                Social History   Tobacco Use  Smoking Status Never  Smokeless Tobacco Never    Goals Met:  Independence with exercise equipment Exercise tolerated well No report of concerns or symptoms today  Goals Unmet:  Not Applicable  Comments: Pt able to follow exercise prescription today without complaint.  Will continue to monitor for progression.    Dr. Bethann Punches is Medical Director for [facility] Cardiac Rehabilitation.  Dr. Vida Rigger is Medical Director for [facility] Pulmonary Rehabilitation.

## 2024-02-25 LAB — BASIC METABOLIC PANEL WITH GFR
BUN/Creatinine Ratio: 15 (ref 12–28)
BUN: 15 mg/dL (ref 8–27)
CO2: 22 mmol/L (ref 20–29)
Calcium: 10 mg/dL (ref 8.7–10.3)
Chloride: 103 mmol/L (ref 96–106)
Creatinine, Ser: 1 mg/dL (ref 0.57–1.00)
Glucose: 91 mg/dL (ref 70–99)
Potassium: 4.3 mmol/L (ref 3.5–5.2)
Sodium: 142 mmol/L (ref 134–144)
eGFR: 64 mL/min/{1.73_m2} (ref >59)

## 2024-02-27 ENCOUNTER — Encounter: Admitting: *Deleted

## 2024-02-27 DIAGNOSIS — I213 ST elevation (STEMI) myocardial infarction of unspecified site: Secondary | ICD-10-CM

## 2024-02-27 DIAGNOSIS — Z48812 Encounter for surgical aftercare following surgery on the circulatory system: Secondary | ICD-10-CM | POA: Diagnosis not present

## 2024-02-27 DIAGNOSIS — Z955 Presence of coronary angioplasty implant and graft: Secondary | ICD-10-CM

## 2024-02-27 NOTE — Progress Notes (Signed)
 Daily Session Note  Patient Details  Name: Leslie Shepard MRN: [id number] Date of Birth: 1961-03-29 Referring Provider:   Flowsheet Row Cardiac Rehab from 02/07/2024 in [facility] Cardiac and Pulmonary Rehab  Referring Provider Dr. Veryl Gottron End       Encounter Date: 02/27/2024  Check In:  Session Check In - 02/27/24 0930       Check-In   Supervising physician immediately available to respond to emergencies See telemetry face sheet for immediately available ER MD    Location [facility]-Cardiac & Pulmonary Rehab    Staff Present Maud Sorenson, RN, BSN, CCRP;Maxon Conetta BS, Exercise Physiologist;Kelly Sabra Cramp BS, ACSM CEP, Exercise Physiologist;Margaret Best, MS, Exercise Physiologist    Virtual Visit No    Medication changes reported     No    Fall or balance concerns reported    No    Warm-up and Cool-down Performed on first and last piece of equipment    Resistance Training Performed Yes    VAD Patient? No    PAD/SET Patient? No      Pain Assessment   Currently in Pain? No/denies                Social History   Tobacco Use  Smoking Status Never  Smokeless Tobacco Never    Goals Met:  Independence with exercise equipment Exercise tolerated well No report of concerns or symptoms today  Goals Unmet:  Not Applicable  Comments: Pt able to follow exercise prescription today without complaint.  Will continue to monitor for progression.    Dr. Firman Hughes is Medical Director for [facility] Cardiac Rehabilitation.  Dr. Fuad Aleskerov is Medical Director for [name] Pulmonary Rehabilitation.

## 2024-02-29 ENCOUNTER — Encounter (HOSPITAL_COMMUNITY): Payer: Self-pay

## 2024-02-29 ENCOUNTER — Encounter: Admitting: *Deleted

## 2024-02-29 DIAGNOSIS — I213 ST elevation (STEMI) myocardial infarction of unspecified site: Secondary | ICD-10-CM

## 2024-02-29 DIAGNOSIS — Z48812 Encounter for surgical aftercare following surgery on the circulatory system: Secondary | ICD-10-CM | POA: Diagnosis not present

## 2024-02-29 DIAGNOSIS — Z955 Presence of coronary angioplasty implant and graft: Secondary | ICD-10-CM

## 2024-02-29 NOTE — Progress Notes (Signed)
 Daily Session Note  Patient Details  Name: Leslie Shepard MRN: [id number] Date of Birth: 15-Nov-1961 Referring Provider:   Flowsheet Row Cardiac Rehab from 02/07/2024 in [facility] Cardiac and Pulmonary Rehab  Referring Provider Dr. Veryl Gottron End       Encounter Date: 02/29/2024  Check In:  Session Check In - 02/29/24 0957       Check-In   Supervising physician immediately available to respond to emergencies See telemetry face sheet for immediately available ER MD    Location [facility]-Cardiac & Pulmonary Rehab    Staff Present Maud Sorenson, RN, BSN, CCRP;Margaret Best, MS, Exercise Physiologist;Maxon Conetta BS, Exercise Physiologist    Virtual Visit No    Medication changes reported     No    Fall or balance concerns reported    No    Warm-up and Cool-down Performed on first and last piece of equipment    Resistance Training Performed Yes    VAD Patient? No    PAD/SET Patient? No      Pain Assessment   Currently in Pain? No/denies                Social History   Tobacco Use  Smoking Status Never  Smokeless Tobacco Never    Goals Met:  Independence with exercise equipment Exercise tolerated well No report of concerns or symptoms today  Goals Unmet:  Not Applicable  Comments: Pt able to follow exercise prescription today without complaint.  Will continue to monitor for progression.    Dr. Firman Hughes is Medical Director for [facility] Cardiac Rehabilitation.  Dr. Fuad Aleskerov is Medical Director for [facility] Pulmonary Rehabilitation.

## 2024-03-02 ENCOUNTER — Other Ambulatory Visit: Payer: Self-pay | Admitting: Cardiology

## 2024-03-02 ENCOUNTER — Ambulatory Visit (HOSPITAL_COMMUNITY)
Admission: RE | Admit: 2024-03-02 | Discharge: 2024-03-02 | Disposition: A | Discharge status: Home / Self Care | Source: Ambulatory Visit | Attending: Cardiology | Admitting: Cardiology

## 2024-03-02 ENCOUNTER — Encounter

## 2024-03-02 DIAGNOSIS — I2119 ST elevation (STEMI) myocardial infarction involving other coronary artery of inferior wall: Secondary | ICD-10-CM

## 2024-03-02 DIAGNOSIS — I4721 Torsades de pointes: Secondary | ICD-10-CM

## 2024-03-02 MED ORDER — GADOBUTROL 1 MMOL/ML IV SOLN
10.0000 mL | Freq: Once | INTRAVENOUS | Status: AC | PRN
Start: 2024-03-02 — End: 2024-03-02
  Administered 2024-03-02: 10 mL via INTRAVENOUS

## 2024-03-05 ENCOUNTER — Encounter: Admitting: *Deleted

## 2024-03-05 DIAGNOSIS — Z48812 Encounter for surgical aftercare following surgery on the circulatory system: Secondary | ICD-10-CM | POA: Diagnosis not present

## 2024-03-05 DIAGNOSIS — I213 ST elevation (STEMI) myocardial infarction of unspecified site: Secondary | ICD-10-CM | POA: Diagnosis not present

## 2024-03-05 DIAGNOSIS — Z955 Presence of coronary angioplasty implant and graft: Secondary | ICD-10-CM

## 2024-03-05 NOTE — Progress Notes (Signed)
 Daily Session Note  Patient Details  Name: Leslie Shepard MRN: [id number] Date of Birth: 04-24-1961 Referring Provider:   Flowsheet Row Cardiac Rehab from 02/07/2024 in [facility] Cardiac and Pulmonary Rehab  Referring Provider Dr. Veryl Gottron End       Encounter Date: 03/05/2024  Check In:  Session Check In - 03/05/24 0931       Check-In   Supervising physician immediately available to respond to emergencies See telemetry face sheet for immediately available ER MD    Location [facility]-Cardiac & Pulmonary Rehab    Staff Present Maud Sorenson, RN, BSN, CCRP;Kelly Hayes BS, ACSM CEP, Exercise Physiologist;Noah Tickle, BS, Exercise Physiologist;Joseph Hood RCP,RRT,BSRT    Virtual Visit No    Medication changes reported     No    Fall or balance concerns reported    No    Warm-up and Cool-down Performed on first and last piece of equipment    Resistance Training Performed Yes    VAD Patient? No    PAD/SET Patient? No      Pain Assessment   Currently in Pain? No/denies                Social History   Tobacco Use  Smoking Status Never  Smokeless Tobacco Never    Goals Met:  Independence with exercise equipment Exercise tolerated well No report of concerns or symptoms today  Goals Unmet:  Not Applicable  Comments: Pt able to follow exercise prescription today without complaint.  Will continue to monitor for progression.    Dr. Firman Hughes is Medical Director for [facility] Cardiac Rehabilitation.  Dr. Fuad Aleskerov is Medical Director for [facility] Pulmonary Rehabilitation.

## 2024-03-06 NOTE — Progress Notes (Unsigned)
  Electrophysiology Office Follow up Visit Note:    Date:  03/07/2024   ID:  Leslie Shepard, DOB 02/10/1961, MRN [id number]  PCP:  Shayne Demark, MD  [facility] Cardiologist:  Belva Boyden, MD  [facility] Electrophysiologist:  Boyce Byes, MD    Interval History:     Leslie Shepard is a 63 y.o. female who presents for a follow up visit.   I last saw the patient when she was hospitalized in February 2025 with chest discomfort.  She was taken emergently to the Cath Lab where the LAD was noted to be 100% occluded.  This was successfully revascularized but there was sluggish flow in the vessel after PCI.  Shortly after heart catheterization she had several episodes of VT/VF requiring CPR and defibrillation.  Amiodarone  and lidocaine  were started.  Her echo during that hospitalization showed a reduced ejection fraction.  We believed her arrhythmias were related to the sluggish flow shortly after revascularization and recommended aggressive medical therapy.  We plan for cardiac MRI and follow-up to discuss whether or not an ICD was needed.        Past medical, surgical, social and family history were reviewed.  ROS:   Please see the history of present illness.    All other systems reviewed and are negative.  EKGs/Labs/Other Studies Reviewed:    The following studies were reviewed today:  March 03, 2024 cardiac MRI Normal LV size and function, EF 56% Prior LAD infarct with perfusion defect, wall motion abnormality and delayed enhancement Normal RV size and function  January 11, 2024 echo EF 35-40 RV low normal Mild MR        Physical Exam:    VS:  BP 138/68   Pulse 67   Ht 5\' 4"  (1.626 m)   Wt 130 lb (59 kg)   SpO2 98%   BMI 22.31 kg/m     Wt Readings from Last 3 Encounters:  03/07/24 130 lb (59 kg)  02/07/24 130 lb 11.2 oz (59.3 kg)  02/06/24 124 lb 6.4 oz (56.4 kg)     GEN: no distress CARD: RRR, No MRG RESP: No IWOB. CTAB.       ASSESSMENT:    1. Coronary artery disease involving native coronary artery of native heart without angina pectoris   2. Cardiac arrest (HCC)   3. VF (ventricular fibrillation) (HCC)    PLAN:    In order of problems listed above:  #Coronary artery disease #VT/VF Doing well after LAD PCI in February.  Recent cardiac MRI shows a completely recovered ejection fraction.  There is evidence of prior infarct in the anterior wall.  Her rhythm has remained quiescent on amiodarone  and mexiletine. With her normalized ejection fraction, I believe she is at low risk for future malignant ventricular arrhythmias.  I believe that she should be okay to discontinue LifeVest at this time.  I will have her reduce her amiodarone  to 100 mg by mouth once daily for 6 weeks and then stop this medication.  She will continue on mexiletine until the next follow-up appointment.  If she remains electrically stable, we will discontinue mexiletine at the next appointment in approximately 3 months.  Continue aspirin  and prasugrel .  Continue Repatha  and Crestor .  Follow up 3 mo with APP.     Signed, Harvie Liner, MD, [facility], [facility] 03/07/2024 11:11 AM    Electrophysiology [facility]

## 2024-03-07 ENCOUNTER — Ambulatory Visit: Payer: 59 | Attending: Cardiology | Admitting: Cardiology

## 2024-03-07 ENCOUNTER — Encounter: Admitting: *Deleted

## 2024-03-07 ENCOUNTER — Encounter: Payer: Self-pay | Admitting: Cardiology

## 2024-03-07 VITALS — BP 138/68 | HR 67 | Ht 64.0 in | Wt 130.0 lb

## 2024-03-07 DIAGNOSIS — I4901 Ventricular fibrillation: Secondary | ICD-10-CM

## 2024-03-07 DIAGNOSIS — Z48812 Encounter for surgical aftercare following surgery on the circulatory system: Secondary | ICD-10-CM | POA: Diagnosis not present

## 2024-03-07 DIAGNOSIS — I2510 Atherosclerotic heart disease of native coronary artery without angina pectoris: Secondary | ICD-10-CM

## 2024-03-07 DIAGNOSIS — I213 ST elevation (STEMI) myocardial infarction of unspecified site: Secondary | ICD-10-CM | POA: Diagnosis not present

## 2024-03-07 DIAGNOSIS — I469 Cardiac arrest, cause unspecified: Secondary | ICD-10-CM | POA: Diagnosis not present

## 2024-03-07 DIAGNOSIS — Z955 Presence of coronary angioplasty implant and graft: Secondary | ICD-10-CM

## 2024-03-07 MED ORDER — AMIODARONE HCL 100 MG PO TABS: 100.0000 mg | ORAL_TABLET | Freq: Every day | ORAL | 0 refills | Status: DC

## 2024-03-07 NOTE — Progress Notes (Signed)
 Daily Session Note  Patient Details  Name: Leslie Shepard MRN: [id number] Date of Birth: 11/01/1961 Referring Provider:   Flowsheet Row Cardiac Rehab from 02/07/2024 in [facility] Cardiac and Pulmonary Rehab  Referring Provider Dr. Veryl Gottron End       Encounter Date: 03/07/2024  Check In:  Session Check In - 03/07/24 1351       Check-In   Supervising physician immediately available to respond to emergencies See telemetry face sheet for immediately available ER MD    Location [facility]-Cardiac & Pulmonary Rehab    Staff Present Sue Em RN,BSN;Kelly Sabra Cramp BS, ACSM CEP, Exercise Physiologist;Susanne Bice, RN, BSN, CCRP;Noah Tickle, BS, Exercise Physiologist    Virtual Visit No    Medication changes reported     No    Fall or balance concerns reported    No    Warm-up and Cool-down Performed on first and last piece of equipment    Resistance Training Performed Yes    VAD Patient? No    PAD/SET Patient? No      Pain Assessment   Currently in Pain? No/denies                Social History   Tobacco Use  Smoking Status Never  Smokeless Tobacco Never    Goals Met:  Independence with exercise equipment Exercise tolerated well No report of concerns or symptoms today Strength training completed today  Goals Unmet:  Not Applicable  Comments: Pt able to follow exercise prescription today without complaint.  Will continue to monitor for progression.    Dr. Firman Hughes is Medical Director for [facility] Cardiac Rehabilitation.  Dr. Fuad Aleskerov is Medical Director for [facility] Pulmonary Rehabilitation.

## 2024-03-07 NOTE — Patient Instructions (Addendum)
 Medication Instructions:  Your physician has recommended you make the following change in your medication:  1) DECREASE amiodarone  to 100 mg once daily for 6 weeks then discontinue *If you need a refill on your cardiac medications before your next appointment, please call your pharmacy*  Follow-Up: At [facility], you and your health needs are our priority.  As part of our continuing mission to provide you with exceptional heart care, our providers are all part of one team.  This team includes your primary Cardiologist (physician) and Advanced Practice Providers or APPs (Physician Assistants and Nurse Practitioners) who all work together to provide you with the care you need, when you need it.  Your next appointment:   3 months  Provider:   Suzann Riddle, NP

## 2024-03-08 ENCOUNTER — Telehealth: Payer: Self-pay | Admitting: Pharmacy Technician

## 2024-03-08 ENCOUNTER — Telehealth: Payer: Self-pay | Admitting: Cardiovascular Disease

## 2024-03-08 ENCOUNTER — Other Ambulatory Visit (HOSPITAL_COMMUNITY): Payer: Self-pay

## 2024-03-08 MED ORDER — EMPAGLIFLOZIN 10 MG PO TABS: 10.0000 mg | ORAL_TABLET | Freq: Every day | ORAL | 3 refills | Status: AC

## 2024-03-08 MED ORDER — MEXILETINE HCL 150 MG PO CAPS: 150.0000 mg | ORAL_CAPSULE | Freq: Two times a day (BID) | ORAL | 0 refills | Status: DC

## 2024-03-08 MED ORDER — METOPROLOL SUCCINATE ER 25 MG PO TB24: 12.5000 mg | ORAL_TABLET | Freq: Every day | ORAL | 3 refills | Status: AC

## 2024-03-08 MED ORDER — LOSARTAN POTASSIUM 25 MG PO TABS: 12.5000 mg | ORAL_TABLET | Freq: Every evening | ORAL | 3 refills | Status: DC

## 2024-03-08 MED ORDER — EZETIMIBE 10 MG PO TABS: 10.0000 mg | ORAL_TABLET | Freq: Every day | ORAL | 3 refills | Status: AC

## 2024-03-08 MED ORDER — PRASUGREL HCL 5 MG PO TABS: 5.0000 mg | ORAL_TABLET | Freq: Every day | ORAL | 3 refills | Status: AC

## 2024-03-08 NOTE — Telephone Encounter (Signed)
 Pt c/o medication issue:  1. Name of Medication: losartan  (COZAAR ) 25 MG tablet aspirin  81 MG chewable tablet  empagliflozin  (JARDIANCE ) 10 MG TABS tablet  ezetimibe  (ZETIA ) 10 MG tablet  metoprolol  succinate (TOPROL -XL) 25 MG 24 hr tablet  prasugrel  (EFFIENT ) 5 MG TABS tablet  mexiletine (MEXITIL ) 150 MG capsule   2. How are you currently taking this medication (dosage and times per day)? Yes  3. Are you having a reaction (difficulty breathing--STAT)? No  4. What is your medication issue? Pt would like to know if she could get a 4 month supply due to going out [place] country if so please contact pt.

## 2024-03-08 NOTE — Telephone Encounter (Signed)
 Left voicemail message to call back

## 2024-03-08 NOTE — Telephone Encounter (Signed)
 Pt made aware. Refills sent as requested   Morey Ar, NP  Dudley Ghee, RN2 hours ago (11:13 AM)    Yes. That is fine.  DW   Dudley Ghee, RN  Wittenborn, Deborah, NP2 hours ago (11:06 AM)    Can I send in 90 day supply for her medications? Looks like hospitalist sent in only 30 days and she is going out of the [place].

## 2024-03-08 NOTE — Telephone Encounter (Signed)
 Pt was returning nurse call and is requesting a callback. Please advise.

## 2024-03-08 NOTE — Telephone Encounter (Signed)
 Pharmacy Patient Advocate Encounter   Received notification from Fax that prior authorization for losartan  is required/requested.   Insurance verification completed.   The patient is insured through [employer] .   Per test claim: PA required; PA submitted to above mentioned insurance via CoverMyMeds Key/confirmation #/EOC [facility] Status is pending

## 2024-03-08 NOTE — Telephone Encounter (Signed)
 Pharmacy Patient Advocate Encounter  Received notification from [employer] that Prior Authorization for Losartan  has been CANCELLED due to   PA #/Case ID/Reference #: [id number]

## 2024-03-09 ENCOUNTER — Encounter: Admitting: *Deleted

## 2024-03-09 ENCOUNTER — Encounter

## 2024-03-09 DIAGNOSIS — I213 ST elevation (STEMI) myocardial infarction of unspecified site: Secondary | ICD-10-CM

## 2024-03-09 DIAGNOSIS — Z48812 Encounter for surgical aftercare following surgery on the circulatory system: Secondary | ICD-10-CM | POA: Diagnosis not present

## 2024-03-09 DIAGNOSIS — Z955 Presence of coronary angioplasty implant and graft: Secondary | ICD-10-CM

## 2024-03-09 NOTE — Progress Notes (Signed)
 Daily Session Note  Patient Details  Name: Leslie Shepard MRN: [id number] Date of Birth: 1961/01/18 Referring Provider:   Flowsheet Row Cardiac Rehab from 02/07/2024 in [facility] Cardiac and Pulmonary Rehab  Referring Provider Dr. Veryl Gottron End       Encounter Date: 03/09/2024  Check In:  Session Check In - 03/09/24 0937       Check-In   Supervising physician immediately available to respond to emergencies See telemetry face sheet for immediately available ER MD    Location [facility]-Cardiac & Pulmonary Rehab    Staff Present Maxon Conetta BS, Exercise Physiologist;Noah Tickle, BS, Exercise Physiologist;Senetra Dillin, RN, BSN, CCRP;Joseph Hood RCP,RRT,BSRT    Virtual Visit No    Medication changes reported     Yes    Comments lowering Amiodarone  dose    Fall or balance concerns reported    No    Warm-up and Cool-down Performed on first and last piece of equipment    Resistance Training Performed Yes    VAD Patient? No    PAD/SET Patient? No      Pain Assessment   Currently in Pain? No/denies                Social History   Tobacco Use  Smoking Status Never  Smokeless Tobacco Never    Goals Met:  Independence with exercise equipment Exercise tolerated well No report of concerns or symptoms today  Goals Unmet:  Not Applicable  Comments: Pt able to follow exercise prescription today without complaint.  Will continue to monitor for progression.    Dr. Firman Hughes is Medical Director for [facility] Cardiac Rehabilitation.  Dr. Fuad Aleskerov is Medical Director for [facility] Pulmonary Rehabilitation.

## 2024-03-12 ENCOUNTER — Encounter: Admitting: *Deleted

## 2024-03-12 DIAGNOSIS — I213 ST elevation (STEMI) myocardial infarction of unspecified site: Secondary | ICD-10-CM | POA: Diagnosis not present

## 2024-03-12 DIAGNOSIS — Z955 Presence of coronary angioplasty implant and graft: Secondary | ICD-10-CM

## 2024-03-12 DIAGNOSIS — Z48812 Encounter for surgical aftercare following surgery on the circulatory system: Secondary | ICD-10-CM | POA: Diagnosis not present

## 2024-03-12 NOTE — Progress Notes (Signed)
 Daily Session Note  Patient Details  Name: BLAKENEY SUITT MRN: [id number] Date of Birth: 05/06/1961 Referring Provider:   Flowsheet Row Cardiac Rehab from 02/07/2024 in [facility] Cardiac and Pulmonary Rehab  Referring Provider Dr. Veryl Gottron End       Encounter Date: 03/12/2024  Check In:  Session Check In - 03/12/24 6433       Check-In   Supervising physician immediately available to respond to emergencies See telemetry face sheet for immediately available ER MD    Location [facility]-Cardiac & Pulmonary Rehab    Staff Present Maud Sorenson, RN, BSN, CCRP;Joseph Hood RCP,RRT,BSRT;Kelly [place] BS, ACSM CEP, Exercise Physiologist;Maxon Conetta BS, Exercise Physiologist;Jason Martina Sledge RDN,LDN    Virtual Visit No    Medication changes reported     No    Fall or balance concerns reported    No    Warm-up and Cool-down Performed on first and last piece of equipment    Resistance Training Performed Yes    VAD Patient? No    PAD/SET Patient? No      Pain Assessment   Currently in Pain? No/denies                Social History   Tobacco Use  Smoking Status Never  Smokeless Tobacco Never    Goals Met:  Independence with exercise equipment Exercise tolerated well No report of concerns or symptoms today  Goals Unmet:  Not Applicable  Comments: Pt able to follow exercise prescription today without complaint.  Will continue to monitor for progression.    Dr. Firman Hughes is Medical Director for [facility] Cardiac Rehabilitation.  Dr. Fuad Aleskerov is Medical Director for [facility] Pulmonary Rehabilitation.

## 2024-03-14 ENCOUNTER — Encounter: Admitting: *Deleted

## 2024-03-14 DIAGNOSIS — Z955 Presence of coronary angioplasty implant and graft: Secondary | ICD-10-CM

## 2024-03-14 DIAGNOSIS — Z48812 Encounter for surgical aftercare following surgery on the circulatory system: Secondary | ICD-10-CM | POA: Diagnosis not present

## 2024-03-14 DIAGNOSIS — I213 ST elevation (STEMI) myocardial infarction of unspecified site: Secondary | ICD-10-CM | POA: Diagnosis not present

## 2024-03-14 NOTE — Progress Notes (Signed)
 Daily Session Note  Patient Details  Name: Leslie Shepard MRN: [id number] Date of Birth: 08-11-1961 Referring Provider:   Flowsheet Row Cardiac Rehab from 02/07/2024 in [facility] Cardiac and Pulmonary Rehab  Referring Provider Dr. Veryl Gottron End       Encounter Date: 03/14/2024  Check In:  Session Check In - 03/14/24 0926       Check-In   Supervising physician immediately available to respond to emergencies See telemetry face sheet for immediately available ER MD    Location [facility]-Cardiac & Pulmonary Rehab    Staff Present Maxon Conetta BS, Exercise Physiologist;Noah Tickle, BS, Exercise Physiologist;Tyleek Smick, RN, BSN, CCRP;Joseph Hood RCP,RRT,BSRT    Virtual Visit No    Medication changes reported     No    Fall or balance concerns reported    No    Warm-up and Cool-down Performed on first and last piece of equipment    Resistance Training Performed Yes    VAD Patient? No    PAD/SET Patient? No      Pain Assessment   Currently in Pain? No/denies                Social History   Tobacco Use  Smoking Status Never  Smokeless Tobacco Never    Goals Met:  Independence with exercise equipment Exercise tolerated well No report of concerns or symptoms today  Goals Unmet:  Not Applicable  Comments: Pt able to follow exercise prescription today without complaint.  Will continue to monitor for progression.    Dr. Firman Hughes is Medical Director for [facility] Cardiac Rehabilitation.  Dr. Fuad Aleskerov is Medical Director for [facility] Pulmonary Rehabilitation.

## 2024-03-15 ENCOUNTER — Encounter: Attending: Internal Medicine | Admitting: *Deleted

## 2024-03-15 DIAGNOSIS — I252 Old myocardial infarction: Secondary | ICD-10-CM | POA: Insufficient documentation

## 2024-03-15 DIAGNOSIS — Z955 Presence of coronary angioplasty implant and graft: Secondary | ICD-10-CM | POA: Diagnosis present

## 2024-03-15 DIAGNOSIS — I213 ST elevation (STEMI) myocardial infarction of unspecified site: Secondary | ICD-10-CM | POA: Diagnosis present

## 2024-03-15 DIAGNOSIS — Z48812 Encounter for surgical aftercare following surgery on the circulatory system: Secondary | ICD-10-CM | POA: Diagnosis not present

## 2024-03-15 NOTE — Progress Notes (Signed)
 Daily Session Note  Patient Details  Name: Leslie Shepard MRN: [id number] Date of Birth: August 24, 1961 Referring Provider:   Flowsheet Row Cardiac Rehab from 02/07/2024 in [facility] Cardiac and Pulmonary Rehab  Referring Provider Dr. Veryl Gottron End       Encounter Date: 03/15/2024  Check In:  Session Check In - 03/15/24 0933       Check-In   Supervising physician immediately available to respond to emergencies See telemetry face sheet for immediately available ER MD    Location [facility]-Cardiac & Pulmonary Rehab    Staff Present Lyell Samuel, MS, Exercise Physiologist;Nami Strawder, RN, BSN, CCRP;Joseph Hood RCP,RRT,BSRT;Maxon Conetta BS, Exercise Physiologist    Virtual Visit No    Medication changes reported     No    Fall or balance concerns reported    No    Warm-up and Cool-down Performed on first and last piece of equipment    Resistance Training Performed Yes    VAD Patient? No    PAD/SET Patient? No      Pain Assessment   Currently in Pain? No/denies                Social History   Tobacco Use  Smoking Status Never  Smokeless Tobacco Never    Goals Met:  Independence with exercise equipment Exercise tolerated well No report of concerns or symptoms today  Goals Unmet:  Not Applicable  Comments: Pt able to follow exercise prescription today without complaint.  Will continue to monitor for progression.    Dr. Firman Hughes is Medical Director for [facility] Cardiac Rehabilitation.  Dr. Fuad Aleskerov is Medical Director for [facility] Pulmonary Rehabilitation.

## 2024-03-16 ENCOUNTER — Encounter

## 2024-03-19 ENCOUNTER — Encounter: Admitting: *Deleted

## 2024-03-19 DIAGNOSIS — I213 ST elevation (STEMI) myocardial infarction of unspecified site: Secondary | ICD-10-CM | POA: Diagnosis not present

## 2024-03-19 DIAGNOSIS — Z48812 Encounter for surgical aftercare following surgery on the circulatory system: Secondary | ICD-10-CM | POA: Diagnosis not present

## 2024-03-19 DIAGNOSIS — Z955 Presence of coronary angioplasty implant and graft: Secondary | ICD-10-CM

## 2024-03-19 NOTE — Progress Notes (Signed)
 Daily Session Note  Patient Details  Name: Leslie Shepard MRN: [id number] Date of Birth: 01-31-1961 Referring Provider:   Flowsheet Row Cardiac Rehab from 02/07/2024 in [facility]  Referring Provider Dr. Veryl Gottron End       Encounter Date: 03/19/2024  Check In:  Session Check In - 03/19/24 0929       Check-In   Supervising physician immediately available to respond to emergencies See telemetry face sheet for immediately available ER MD    Location [facility]    Staff Present Maud Sorenson, RN, BSN, CCRP;Joseph Hood RCP,RRT,BSRT;Kelly Shanor-[place] BS, ACSM CEP, Exercise Physiologist;Maxon Conetta BS, Exercise Physiologist    Virtual Visit No    Medication changes reported     No    Fall or balance concerns reported    No    Warm-up and Cool-down Performed on first and last piece of equipment    Resistance Training Performed Yes    VAD Patient? No    PAD/SET Patient? No      Pain Assessment   Currently in Pain? No/denies                Social History   Tobacco Use  Smoking Status Never  Smokeless Tobacco Never    Goals Met:  Independence with exercise equipment Exercise tolerated well No report of concerns or symptoms today  Goals Unmet:  Not Applicable  Comments: Pt able to follow exercise prescription today without complaint.  Will continue to monitor for progression.    Dr. Firman Hughes is Medical Director for [facility] Cardiac Rehabilitation.  Dr. Fuad Aleskerov is Medical Director for [facility] Pulmonary Rehabilitation.

## 2024-03-21 ENCOUNTER — Encounter: Admitting: *Deleted

## 2024-03-21 DIAGNOSIS — I213 ST elevation (STEMI) myocardial infarction of unspecified site: Secondary | ICD-10-CM

## 2024-03-21 DIAGNOSIS — Z955 Presence of coronary angioplasty implant and graft: Secondary | ICD-10-CM

## 2024-03-21 DIAGNOSIS — Z48812 Encounter for surgical aftercare following surgery on the circulatory system: Secondary | ICD-10-CM | POA: Diagnosis not present

## 2024-03-21 NOTE — Progress Notes (Signed)
 30 Day review completed. Medical Director ITP review done, changes made as directed, and signed approval by Medical Director. ? ?

## 2024-03-21 NOTE — Progress Notes (Signed)
 Daily Session Note  Patient Details  Name: Leslie Shepard MRN: [id number] Date of Birth: December 16, 1960 Referring Provider:   Flowsheet Row Cardiac Rehab from 02/07/2024 in [facility] Cardiac and Pulmonary Rehab  Referring Provider Dr. Veryl Gottron End       Encounter Date: 03/21/2024  Check In:  Session Check In - 03/21/24 0929       Check-In   Supervising physician immediately available to respond to emergencies See telemetry face sheet for immediately available ER MD    Location [facility]Cardiac & Pulmonary Re[facility]    Staff Present Maud Sorenson, RN, BSN, CCRP;Meredith Manson Seitz RN,BSN;Joseph Hood RCP,RRT,BSRT;Maxon [place] BS, Exercise Physiologist;Laureen Bevin Bucks, BS, RRT, CPFT    Virtual Visit No    Medication changes reported     No    Fall or balance concerns reported    No    Warm-up and Cool-down Performed on first and last piece of equipment    Resistance Training Performed Yes    VAD Patient? No    PAD/SET Patient? No      Pain Assessment   Currently in Pain? No/denies                Social History   Tobacco Use  Smoking Status Never  Smokeless Tobacco Never    Goals Met:  Independence with exercise equipment Exercise tolerated well No report of concerns or symptoms today  Goals Unmet:  Not Applicable  Comments: Pt able to follow exercise prescription today without complaint.  Will continue to monitor for progression.    Dr. Firman Hughes is Medical Director for [facility] Cardiac Rehabilitation.  Dr. Fuad Aleskerov is Medical Director for [facility] Pulmonary Rehabilitation.

## 2024-03-21 NOTE — Progress Notes (Signed)
 Cardiac Individual Treatment Plan  Patient Details  Name: Leslie Shepard MRN: [id number] Date of Birth: 1961/11/15 Referring Provider:   Flowsheet Row Cardiac Rehab from 02/07/2024 in [facility] Cardiac and Pulmonary Rehab  Referring Provider Dr. Veryl Gottron End       Initial Encounter Date:  Flowsheet Row Cardiac Rehab from 02/07/2024 in [facility] Cardiac and Pulmonary Rehab  Date 02/07/24       Visit Diagnosis: ST elevation myocardial infarction (STEMI), unspecified artery [facility])  Status post coronary artery stent placement  Patient's Home Medications on Admission:  Current Outpatient Medications:    amiodarone  (PACERONE ) 100 MG tablet, Take 1 tablet (100 mg total) by mouth daily., Disp: 56 tablet, Rfl: 0   aspirin  81 MG chewable tablet, Chew 1 tablet (81 mg total) by mouth daily., Disp: 90 tablet, Rfl: 1   empagliflozin  (JARDIANCE ) 10 MG TABS tablet, Take 1 tablet (10 mg total) by mouth daily., Disp: 90 tablet, Rfl: 3   Evolocumab  (REPATHA  SURECLICK) 140 MG/ML SOAJ, Inject 140 mg into the skin every 14 (fourteen) days., Disp: 2 mL, Rfl: 11   ezetimibe  (ZETIA ) 10 MG tablet, Take 1 tablet (10 mg total) by mouth daily., Disp: 90 tablet, Rfl: 3   losartan  (COZAAR ) 25 MG tablet, Take 0.5 tablets (12.5 mg total) by mouth every evening., Disp: 45 tablet, Rfl: 3   metoprolol  succinate (TOPROL -XL) 25 MG 24 hr tablet, Take 0.5 tablets (12.5 mg total) by mouth daily., Disp: 45 tablet, Rfl: 3   mexiletine (MEXITIL ) 150 MG capsule, Take 1 capsule (150 mg total) by mouth every 12 (twelve) hours., Disp: 180 capsule, Rfl: 0   prasugrel  (EFFIENT ) 5 MG TABS tablet, Take 1 tablet (5 mg total) by mouth daily., Disp: 90 tablet, Rfl: 3  Past Medical History: Past Medical History:  Diagnosis Date   Hyperlipidemia    PMDD (premenstrual dysphoric disorder)     Tobacco Use: Social History   Tobacco Use  Smoking Status Never  Smokeless Tobacco Never    Labs: Review Flowsheet  More data exists       Latest Ref Rng & Units 12/24/2009 02/16/2013 01/17/2019 10/04/2023 01/11/2024  Labs for ITP Cardiac and Pulmonary Rehab  Cholestrol 0 - 200 mg/dL 409  811  914  782  956   LDL (calc) 0 - 99 mg/dL 213  - 086  578  469   Direct LDL mg/dL - 629.5  - - -  HDL-C >28 mg/dL 41.32  44.01  02.72  53.66  60   Trlycerides <150 mg/dL 44.0  347.4  259.5  638.7  68      Exercise Target Goals: Exercise Program Goal: Individual exercise prescription set using results from initial 6 min walk test and THRR while considering  patient's activity barriers and safety.   Exercise Prescription Goal: Initial exercise prescription builds to 30-45 minutes a day of aerobic activity, 2-3 days per week.  Home exercise guidelines will be given to patient during program as part of exercise prescription that the participant will acknowledge.   Education: Aerobic Exercise: - Group verbal and visual presentation on the components of exercise prescription. Introduces F.I.T.T principle from ACSM for exercise prescriptions.  Reviews F.I.T.T. principles of aerobic exercise including progression. Written material given at graduation. Flowsheet Row Cardiac Rehab from 03/21/2024 in [facility] Cardiac and Pulmonary Rehab  Education need identified 02/07/24       Education: Resistance Exercise: - Group verbal and visual presentation on the components of exercise prescription. Introduces F.I.T.T principle from ACSM for  exercise prescriptions  Reviews F.I.T.T. principles of resistance exercise including progression. Written material given at graduation.    Education: Exercise & Equipment Safety: - Individual verbal instruction and demonstration of equipment use and safety with use of the equipment. Flowsheet Row Cardiac Rehab from 03/21/2024 in [facility] Cardiac and Pulmonary Rehab  Date 02/07/24  Educator [facility]  Instruction Review Code 1- Verbalizes Understanding       Education: Exercise Physiology & General Exercise Guidelines: - Group verbal  and written instruction with models to review the exercise physiology of the cardiovascular system and associated critical values. Provides general exercise guidelines with specific guidelines to those with heart or lung disease.    Education: Flexibility, Balance, Mind/Body Relaxation: - Group verbal and visual presentation with interactive activity on the components of exercise prescription. Introduces F.I.T.T principle from ACSM for exercise prescriptions. Reviews F.I.T.T. principles of flexibility and balance exercise training including progression. Also discusses the mind body connection.  Reviews various relaxation techniques to help reduce and manage stress (i.e. Deep breathing, progressive muscle relaxation, and visualization). Balance handout provided to take home. Written material given at graduation.   Activity Barriers & Risk Stratification:  Activity Barriers & Cardiac Risk Stratification - 02/07/24 1533       Activity Barriers & Cardiac Risk Stratification   Activity Barriers None    Cardiac Risk Stratification High             6 Minute Walk:  6 Minute Walk     Row Name 02/07/24 1532         6 Minute Walk   Phase Initial     Distance 1620 feet     Walk Time 6 minutes     # of Rest Breaks 0     MPH 3.07     METS 4.1     RPE 9     Perceived Dyspnea  0     VO2 Peak 14.4     Symptoms No     Resting HR 63 bpm     Resting BP 118/64     Resting Oxygen Saturation  97 %     Exercise Oxygen Saturation  during 6 min walk 99 %     Max Ex. HR 96 bpm     Max Ex. BP 138/68     2 Minute Post BP 122/66              Oxygen Initial Assessment:   Oxygen Re-Evaluation:   Oxygen Discharge (Final Oxygen Re-Evaluation):   Initial Exercise Prescription:  Initial Exercise Prescription - 02/07/24 1500       Date of Initial Exercise RX and Referring Provider   Date 02/07/24    Referring Provider Dr. Veryl Gottron End      Oxygen   Maintain Oxygen Saturation 88%  or higher      Treadmill   MPH 3    Grade 2    Minutes 15    METs 4.1      Elliptical   Level 1    Speed 3    Minutes 15    METs 4.1      REL-XR   Level 4    Watts 25    Speed 50    Minutes 15    METs 4.1      Intensity   THRR 40-80% of Max Heartrate 101-139    Ratings of Perceived Exertion 11-13    Perceived Dyspnea 0-4      Resistance Training  Training Prescription Yes    Weight 4lb    Reps 10-15             Perform Capillary Blood Glucose checks as needed.  Exercise Prescription Changes:   Exercise Prescription Changes     Row Name 02/07/24 1500 02/08/24 0900 02/16/24 0800 02/29/24 1300 03/15/24 1600     Response to Exercise   Blood Pressure (Admit) 118/64 118/64 102/64 118/66 102/56   Blood Pressure (Exercise) 138/68 138/68 138/58 144/72 164/70   Blood Pressure (Exit) 122/66 122/66 104/56 92/60 112/62   Heart Rate (Admit) 63 bpm 63 bpm 53 bpm 59 bpm 59 bpm   Heart Rate (Exercise) 96 bpm 96 bpm 114 bpm 129 bpm 130 bpm   Heart Rate (Exit) 59 bpm 59 bpm 70 bpm 79 bpm 84 bpm   Oxygen Saturation (Admit) 97 % 97 % -- -- --   Oxygen Saturation (Exercise) 99 % 99 % -- -- --   Oxygen Saturation (Exit) 99 % 99 % -- -- --   Rating of Perceived Exertion (Exercise) 9 9 13 15 15    Perceived Dyspnea (Exercise) 0 0 -- 0 --   Symptoms none none none none none   Comments results results First 2 exercise sessions First 2 exercise sessions --   Duration -- -- Continue with 30 min of aerobic exercise without signs/symptoms of physical distress. Continue with 30 min of aerobic exercise without signs/symptoms of physical distress. Continue with 30 min of aerobic exercise without signs/symptoms of physical distress.   Intensity -- -- THRR unchanged THRR unchanged THRR unchanged     Progression   Progression -- -- Continue to progress workloads to maintain intensity without signs/symptoms of physical distress. Continue to progress workloads to maintain intensity  without signs/symptoms of physical distress. Continue to progress workloads to maintain intensity without signs/symptoms of physical distress.   Average METs -- -- 4.01 6.4 7.14     Resistance Training   Training Prescription -- Yes Yes Yes Yes   Weight -- 4lb 4lb 4lb 8 lb   Reps -- 10-15 10-15 10-15 10-15     Interval Training   Interval Training -- -- No No No     Treadmill   MPH -- 3 3 4.4 4.2   Grade -- 2 2 6 14    Minutes -- 15 15 15 15    METs -- 4.1 4.12 8.01 13.4     Elliptical   Level -- 1 2 6 9    Speed -- 3 3.5 4 3    Minutes -- 15 15 15 15    METs -- 4.1 3.8 6.4 6.4     REL-XR   Level -- 4 4 12 12    Watts -- 25 -- -- --   Speed -- 50 -- -- --   Minutes -- 15 15 15 15    METs -- 4.1 -- 10.1 9     Rower   Level -- -- -- -- 10   Watts -- -- -- -- 20   Minutes -- -- -- -- 15   METs -- -- -- -- 4.17     Oxygen   Maintain Oxygen Saturation -- -- 88% or higher 88% or higher 88% or higher            Exercise Comments:   Exercise Comments     Row Name 02/08/24 0946           Exercise Comments First full day of exercise!  Patient was oriented to  gym and equipment including functions, settings, policies, and procedures.  Patient's individual exercise prescription and treatment plan were reviewed.  All starting workloads were established based on the results of the 6 minute walk test done at initial orientation visit.  The plan for exercise progression was also introduced and progression will be customized based on patient's performance and goals.                Exercise Goals and Review:   Exercise Goals     Row Name 02/07/24 1536             Exercise Goals   Increase Physical Activity Yes       Intervention Provide advice, education, support and counseling about physical activity/exercise needs.;Develop an individualized exercise prescription for aerobic and resistive training based on initial evaluation findings, risk stratification,  comorbidities and participant's personal goals.       Expected Outcomes Short Term: Attend rehab on a regular basis to increase amount of physical activity.;Long Term: Exercising regularly at least 3-5 days a week.;Long Term: Add in home exercise to make exercise part of routine and to increase amount of physical activity.       Increase Strength and Stamina Yes       Intervention Develop an individualized exercise prescription for aerobic and resistive training based on initial evaluation findings, risk stratification, comorbidities and participant's personal goals.;Provide advice, education, support and counseling about physical activity/exercise needs.       Expected Outcomes Long Term: Improve cardiorespiratory fitness, muscular endurance and strength as measured by increased METs and functional capacity ( );Short Term: Perform resistance training exercises routinely during rehab and add in resistance training at home;Short Term: Increase workloads from initial exercise prescription for resistance, speed, and METs.       Able to understand and use rate of perceived exertion (RPE) scale Yes       Intervention Provide education and explanation on how to use RPE scale       Expected Outcomes Long Term:  Able to use RPE to guide intensity level when exercising independently;Short Term: Able to use RPE daily in rehab to express subjective intensity level       Able to understand and use Dyspnea scale Yes       Intervention Provide education and explanation on how to use Dyspnea scale       Expected Outcomes Short Term: Able to use Dyspnea scale daily in rehab to express subjective sense of shortness of breath during exertion;Long Term: Able to use Dyspnea scale to guide intensity level when exercising independently       Knowledge and understanding of Target Heart Rate Range (THRR) Yes       Intervention Provide education and explanation of THRR including how the numbers were predicted and where they  are located for reference       Expected Outcomes Long Term: Able to use THRR to govern intensity when exercising independently;Short Term: Able to use daily as guideline for intensity in rehab;Short Term: Able to state/look up THRR       Able to check pulse independently Yes       Intervention Review the importance of being able to check your own pulse for safety during independent exercise;Provide education and demonstration on how to check pulse in carotid and radial arteries.       Expected Outcomes Long Term: Able to check pulse independently and accurately;Short Term: Able to explain why pulse checking is important during independent exercise  Understanding of Exercise Prescription Yes       Intervention Provide education, explanation, and written materials on patient's individual exercise prescription       Expected Outcomes Long Term: Able to explain home exercise prescription to exercise independently;Short Term: Able to explain program exercise prescription                Exercise Goals Re-Evaluation :  Exercise Goals Re-Evaluation     Row Name 02/08/24 [phone] 02/16/24 0813 02/29/24 1341 03/12/24 0936 03/15/24 1613     Exercise Goal Re-Evaluation   Exercise Goals Review Able to understand and use rate of perceived exertion (RPE) scale;Able to understand and use Dyspnea scale;Knowledge and understanding of Target Heart Rate Range (THRR);Understanding of Exercise Prescription Increase Physical Activity;Understanding of Exercise Prescription;Increase Strength and Stamina Increase Physical Activity;Understanding of Exercise Prescription;Increase Strength and Stamina Increase Physical Activity;Increase Strength and Stamina;Understanding of Exercise Prescription Increase Physical Activity;Increase Strength and Stamina;Understanding of Exercise Prescription   Comments Reviewed RPE and dyspnea scale, THR and program prescription with pt today.  Pt voiced understanding and was given a copy  of goals to take home. Leslie Shepard is off to a good start in the program. She has tolerated her exercise prescription very well. She did well with her treadmill workload of a speed of 3 mph and incline of 2%. She also did well with level 4 on the XR. She already increased her workload on the elliptical at level 2 with a speed of 3.5 mph. We will continue to monitor her progress in the program. Leslie Shepard continues to do well in rehab. She was recently able to increase her workload on the treadmill to a speed of 4.4 mph and 6% incline. She was able able to increase from level 4 to 12 on the XR, and increase from level 2 to 6 on the elliptical. We will continue to monitor her progress in the program. Leslie Shepard is doing very well at rehab and at home. She was very active before her heart event. She is still active but not as much as she was before. She is excited that she can still hike and exercise regularly. She got a fit bit to monitor her heart rate during exercise. Leslie Shepard continues to do well in rehab. She recently began using the rowing machine and did well at level 10. She also increased her treadmill workload by increasing her incline to 14% while maintaining a speed of 4.2 mph. She also increased to level 9 on the elliptical and increased to 8 lb hand weights for resistance training. We will continue to monitor her progress in the program.   Expected Outcomes Short: Use RPE daily to regulate intensity. Long: Follow program prescription in THR. Short: Continue to progressively increase workloads when appropriate. Long: Continue exercise to improve strength and stamina. Short: Continue to progressively increase workloads when appropriate. Long: Continue exercise to improve strength and stamina. STG: increase workload as able, continue to exercise outside of rehab. LTG: Continue exercise to improve strength and stamina. Short: Continue to progressively increase treadmill workload. Long: Continue exercise to improve strength and  stamina.            Discharge Exercise Prescription (Final Exercise Prescription Changes):  Exercise Prescription Changes - 03/15/24 1600       Response to Exercise   Blood Pressure (Admit) 102/56    Blood Pressure (Exercise) 164/70    Blood Pressure (Exit) 112/62    Heart Rate (Admit) 59 bpm    Heart Rate (Exercise)  130 bpm    Heart Rate (Exit) 84 bpm    Rating of Perceived Exertion (Exercise) 15    Symptoms none    Duration Continue with 30 min of aerobic exercise without signs/symptoms of physical distress.    Intensity THRR unchanged      Progression   Progression Continue to progress workloads to maintain intensity without signs/symptoms of physical distress.    Average METs 7.14      Resistance Training   Training Prescription Yes    Weight 8 lb    Reps 10-15      Interval Training   Interval Training No      Treadmill   MPH 4.2    Grade 14    Minutes 15    METs 13.4      Elliptical   Level 9    Speed 3    Minutes 15    METs 6.4      REL-XR   Level 12    Minutes 15    METs 9      Rower   Level 10    Watts 20    Minutes 15    METs 4.17      Oxygen   Maintain Oxygen Saturation 88% or higher             Nutrition:  Target Goals: Understanding of nutrition guidelines, daily intake of sodium 1500mg , cholesterol 200mg , calories 30% from fat and 7% or less from saturated fats, daily to have 5 or more servings of fruits and vegetables.  Education: All About Nutrition: -Group instruction provided by verbal, written material, interactive activities, discussions, models, and posters to present general guidelines for heart healthy nutrition including fat, fiber, MyPlate, the role of sodium in heart healthy nutrition, utilization of the nutrition label, and utilization of this knowledge for meal planning. Follow up email sent as well. Written material given at graduation. Flowsheet Row Cardiac Rehab from 03/21/2024 in [facility] Cardiac and Pulmonary Rehab   Date 02/15/24  Educator J[facility] part 2  Instruction Review Code 1- Verbalizes Understanding       Biometrics:  Pre Biometrics - 02/07/24 1536       Pre Biometrics   Height 5' 3.94" (1.624 m)    Weight 130 lb 11.2 oz (59.3 kg)    Waist Circumference 28 inches    Hip Circumference 35 inches    Waist to Hip Ratio 0.8 %    BMI (Calculated) 22.48    Single Leg Stand 30 seconds              Nutrition Therapy Plan and Nutrition Goals:   Nutrition Assessments:  MEDIFICTS Score Key: >=70 Need to make dietary changes  40-70 Heart Healthy Diet <= 40 Therapeutic Level Cholesterol Diet  Flowsheet Row Cardiac Rehab from 02/02/2024 in [facility] Cardiac and Pulmonary Rehab  Picture Your Plate Total Score on Admission 83      Picture Your Plate Scores: <16 Unhealthy dietary pattern with much room for improvement. 41-50 Dietary pattern unlikely to meet recommendations for good health and room for improvement. 51-60 More healthful dietary pattern, with some room for improvement.  >60 Healthy dietary pattern, although there may be some specific behaviors that could be improved.    Nutrition Goals Re-Evaluation:  Nutrition Goals Re-Evaluation     Row Name 03/12/24 0943             Goals   Comment Leslie Shepard has been making changes since December of 2024 towards a  healthier diet. She reports she was very strict and felt defeated at times when some days did not work out how she planned. But she has been reading some nutrition books and has found a better mindset towards long term susstainable heart health lifestyle changes. Encouraged and supported her towards making better food choices and eating a more balanced diet that meets needs and allows her to live her life with less stress about every food choice.       Expected Outcome STG: Continue to read labels and keep sodium below 1500mg  daily. LTG: Follow a heart healthy lifestyle                Nutrition Goals Discharge (Final  Nutrition Goals Re-Evaluation):  Nutrition Goals Re-Evaluation - 03/12/24 0943       Goals   Comment Leslie Shepard has been making changes since December of 2024 towards a healthier diet. She reports she was very strict and felt defeated at times when some days did not work out how she planned. But she has been reading some nutrition books and has found a better mindset towards long term susstainable heart health lifestyle changes. Encouraged and supported her towards making better food choices and eating a more balanced diet that meets needs and allows her to live her life with less stress about every food choice.    Expected Outcome STG: Continue to read labels and keep sodium below 1500mg  daily. LTG: Follow a heart healthy lifestyle             Psychosocial: Target Goals: Acknowledge presence or absence of significant depression and/or stress, maximize coping skills, provide positive support system. Participant is able to verbalize types and ability to use techniques and skills needed for reducing stress and depression.   Education: Stress, Anxiety, and Depression - Group verbal and visual presentation to define topics covered.  Reviews how body is impacted by stress, anxiety, and depression.  Also discusses healthy ways to reduce stress and to treat/manage anxiety and depression.  Written material given at graduation. Flowsheet Row Cardiac Rehab from 03/21/2024 in [facility] Cardiac and Pulmonary Rehab  Date 03/21/24  Educator [facility]  Instruction Review Code 1- [employer] Understanding       Education: Sleep Hygiene -Provides group verbal and written instruction about how sleep can affect your health.  Define sleep hygiene, discuss sleep cycles and impact of sleep habits. Review good sleep hygiene tips.    Initial Review & Psychosocial Screening:  Initial Psych Review & Screening - 02/02/24 1237       Initial Review   Current issues with None Identified      Family Dynamics   Good Support  System? Yes   family. friends     Barriers   Psychosocial barriers to participate in program There are no identifiable barriers or psychosocial needs.      Screening Interventions   Interventions Encouraged to exercise;Provide feedback about the scores to participant;To provide support and resources with identified psychosocial needs    Expected Outcomes Short Term goal: Utilizing psychosocial counselor, staff and physician to assist with identification of specific Stressors or current issues interfering with healing process. Setting desired goal for each stressor or current issue identified.;Long Term Goal: Stressors or current issues are controlled or eliminated.;Short Term goal: Identification and review with participant of any Quality of Life or Depression concerns found by scoring the questionnaire.;Long Term goal: The participant improves quality of Life and PHQ9 Scores as seen by post scores and/or verbalization of changes  Quality of Life Scores:   Scores of 19 and below usually indicate a poorer quality of life in these areas.  A difference of  2-3 points is a clinically meaningful difference.  A difference of 2-3 points in the total score of the Quality of Life Index has been associated with significant improvement in overall quality of life, self-image, physical symptoms, and general health in studies assessing change in quality of life.  PHQ-9: Review Flowsheet       02/07/2024 10/11/2023 03/22/2023 01/17/2019  Depression screen PHQ 2/9  Decreased Interest 0 0 0 0  Down, Depressed, Hopeless 0 0 0 0  PHQ - 2 Score 0 0 0 0  Altered sleeping 1 0 0 -  Tired, decreased energy 1 0 0 -  Change in appetite 0 0 0 -  Feeling bad or failure about yourself  0 0 0 -  Trouble concentrating 0 0 0 -  Moving slowly or fidgety/restless 0 0 0 -  Suicidal thoughts 0 0 0 -  PHQ-9 Score 2 0 0 -  Difficult doing work/chores Not difficult at all Not difficult at all Not difficult at all  -   Interpretation of Total Score  Total Score Depression Severity:  1-4 = Minimal depression, 5-9 = Mild depression, 10-14 = Moderate depression, 15-19 = Moderately severe depression, 20-27 = Severe depression   Psychosocial Evaluation and Intervention:  Psychosocial Evaluation - 02/02/24 1245       Psychosocial Evaluation & Interventions   Interventions Encouraged to exercise with the program and follow exercise prescription    Comments Ms. Bozzi is coming to Cardiac Rehab after a STEMI w/ stents. She required CPR and currently has a life vest on. She has been very active during her life and is ready to get back to hiking, biking and other outdoor activities. She states she has been healing well and feels thankful for all the support she has received from family and friends. She has no stress concerns at this time. She is very motivated to get started in the program and is already walking some. She wants to work towards hopefully going hiking during her trip to [place] that she has planned in June.    Expected Outcomes Short: attend cardiac and pulmonary rehab. Long: develop and maintain positive self care habits.    Continue Psychosocial Services  Follow up required by staff             Psychosocial Re-Evaluation:  Psychosocial Re-Evaluation     Row Name 03/12/24 [phone]             Psychosocial Re-Evaluation   Current issues with Current Sleep Concerns       Comments Leslie Shepard reports she is doing well mentally. Denies any anxiety, depression or stress. She has always struggled with sleep, even before the stents were placed. She gets some good nights of sleep but says every third or fouth night she will sleep very poorly.       Expected Outcomes STG: Continue to focus on good sleep. LTG: Achieve and maintain a positive outlook on health and daily life       Interventions Encouraged to attend Cardiac Rehabilitation for the exercise       Continue Psychosocial Services  Follow up  required by staff                Psychosocial Discharge (Final Psychosocial Re-Evaluation):  Psychosocial Re-Evaluation - 03/12/24 9604       Psychosocial Re-Evaluation  Current issues with Current Sleep Concerns    Comments Leslie Shepard reports she is doing well mentally. Denies any anxiety, depression or stress. She has always struggled with sleep, even before the stents were placed. She gets some good nights of sleep but says every third or fouth night she will sleep very poorly.    Expected Outcomes STG: Continue to focus on good sleep. LTG: Achieve and maintain a positive outlook on health and daily life    Interventions Encouraged to attend Cardiac Rehabilitation for the exercise    Continue Psychosocial Services  Follow up required by staff             Vocational Rehabilitation: Provide vocational rehab assistance to qualifying candidates.   Vocational Rehab Evaluation & Intervention:  Vocational Rehab - 02/02/24 1236       Initial Vocational Rehab Evaluation & Intervention   Assessment shows need for Vocational Rehabilitation No             Education: Education Goals: Education classes will be provided on a variety of topics geared toward better understanding of heart health and risk factor modification. Participant will state understanding/return demonstration of topics presented as noted by education test scores.  Learning Barriers/Preferences:  Learning Barriers/Preferences - 02/02/24 1236       Learning Barriers/Preferences   Learning Barriers None    Learning Preferences None             General Cardiac Education Topics:  AED/CPR: - Group verbal and written instruction with the use of models to demonstrate the basic use of the AED with the basic ABC's of resuscitation.   Anatomy and Cardiac Procedures: - Group verbal and visual presentation and models provide information about basic cardiac anatomy and function. Reviews the testing methods done  to diagnose heart disease and the outcomes of the test results. Describes the treatment choices: Medical Management, Angioplasty, or Coronary Bypass Surgery for treating various heart conditions including Myocardial Infarction, Angina, Valve Disease, and Cardiac Arrhythmias.  Written material given at graduation. Flowsheet Row Cardiac Rehab from 03/21/2024 in [facility] Cardiac and Pulmonary Rehab  Education need identified 02/07/24  Date 02/22/24  Educator sb  Instruction Review Code 1- Verbalizes Understanding       Medication Safety: - Group verbal and visual instruction to review commonly prescribed medications for heart and lung disease. Reviews the medication, class of the drug, and side effects. Includes the steps to properly store meds and maintain the prescription regimen.  Written material given at graduation. Flowsheet Row Cardiac Rehab from 03/21/2024 in [facility] Cardiac and Pulmonary Rehab  Date 02/29/24  Educator SB  Instruction Review Code 1- Verbalizes Understanding       Intimacy: - Group verbal instruction through game format to discuss how heart and lung disease can affect sexual intimacy. Written material given at graduation..   Know Your Numbers and Heart Failure: - Group verbal and visual instruction to discuss disease risk factors for cardiac and pulmonary disease and treatment options.  Reviews associated critical values for Overweight/Obesity, Hypertension, Cholesterol, and Diabetes.  Discusses basics of heart failure: signs/symptoms and treatments.  Introduces Heart Failure Zone chart for action plan for heart failure.  Written material given at graduation. Flowsheet Row Cardiac Rehab from 03/21/2024 in [facility] Cardiac and Pulmonary Rehab  Date 03/07/24  Educator SB  Instruction Review Code 1- Verbalizes Understanding       Infection Prevention: - Provides verbal and written material to individual with discussion of infection control including proper hand washing  and proper  equipment cleaning during exercise session. Flowsheet Row Cardiac Rehab from 03/21/2024 in [facility] Cardiac and Pulmonary Rehab  Date 02/07/24  Educator [facility]  Instruction Review Code 1- Verbalizes Understanding       Falls Prevention: - Provides verbal and written material to individual with discussion of falls prevention and safety. Flowsheet Row Cardiac Rehab from 03/21/2024 in [facility] Cardiac and Pulmonary Rehab  Date 02/07/24  Educator [facility]  Instruction Review Code 1- Verbalizes Understanding       Other: -Provides group and verbal instruction on various topics (see comments)   Knowledge Questionnaire Score:  Knowledge Questionnaire Score - 02/02/24 1254       Knowledge Questionnaire Score   Pre Score 23/26             Core Components/Risk Factors/Patient Goals at Admission:  Personal Goals and Risk Factors at Admission - 02/02/24 1234       Core Components/Risk Factors/Patient Goals on Admission   Heart Failure Yes    Intervention Provide a combined exercise and nutrition program that is supplemented with education, support and counseling about heart failure. Directed toward relieving symptoms such as shortness of breath, decreased exercise tolerance, and extremity edema.    Expected Outcomes Improve functional capacity of life;Short term: Attendance in program 2-3 days a week with increased exercise capacity. Reported lower sodium intake. Reported increased fruit and vegetable intake. Reports medication compliance.;Short term: Daily weights obtained and reported for increase. Utilizing diuretic protocols set by physician.;Long term: Adoption of self-care skills and reduction of barriers for early signs and symptoms recognition and intervention leading to self-care maintenance.    Lipids Yes    Intervention Provide education and support for participant on nutrition & aerobic/resistive exercise along with prescribed medications to achieve LDL 70mg , HDL >40mg .    Expected Outcomes  Short Term: Participant states understanding of desired cholesterol values and is compliant with medications prescribed. Participant is following exercise prescription and nutrition guidelines.;Long Term: Cholesterol controlled with medications as prescribed, with individualized exercise RX and with personalized nutrition plan. Value goals: LDL < 70mg , HDL > 40 mg.             Education:Diabetes - Individual verbal and written instruction to review signs/symptoms of diabetes, desired ranges of glucose level fasting, after meals and with exercise. Acknowledge that pre and post exercise glucose checks will be done for 3 sessions at entry of program.   Core Components/Risk Factors/Patient Goals Review:   Goals and Risk Factor Review     Row Name 03/12/24 0947             Core Components/Risk Factors/Patient Goals Review   Personal Goals Review Hypertension       Review Leslie Shepard reports she is monitoring her heart rate during exercise and checking her blood pressure at home, taking her medications as prescribed. Commended her on her progress and commitment towards the program.       Expected Outcomes STG: Continue to check BP and HR at home and ensure it is similar to readings here at rehab. LTG: Manage health risks independently                Core Components/Risk Factors/Patient Goals at Discharge (Final Review):   Goals and Risk Factor Review - 03/12/24 0947       Core Components/Risk Factors/Patient Goals Review   Personal Goals Review Hypertension    Review Leslie Shepard reports she is monitoring her heart rate during exercise and checking her blood pressure at  home, taking her medications as prescribed. Commended her on her progress and commitment towards the program.    Expected Outcomes STG: Continue to check BP and HR at home and ensure it is similar to readings here at rehab. LTG: Manage health risks independently             ITP Comments:  ITP Comments     Row Name  02/02/24 1231 02/07/24 1532 02/08/24 0946 02/22/24 0946 03/21/24 1042   ITP Comments Initial phone call completed. Diagnosis can be found in [facility] 2/25. EP Orientation scheduled for Tuesday 3/25 at 1:30. Completed and gym orientation. Initial ITP created and sent for review to Dr. Firman Hughes, Medical Director. First full day of exercise!  Patient was oriented to gym and equipment including functions, settings, policies, and procedures.  Patient's individual exercise prescription and treatment plan were reviewed.  All starting workloads were established based on the results of the 6 minute walk test done at initial orientation visit.  The plan for exercise progression was also introduced and progression will be customized based on patient's performance and goals. 30 Day review completed. Medical Director ITP review done, changes made as directed, and signed approval by Medical Director.    new to program 30 Day review completed. Medical Director ITP review done, changes made as directed, and signed approval by Medical Director.            Comments: 30 day review

## 2024-03-23 ENCOUNTER — Encounter

## 2024-03-23 ENCOUNTER — Encounter: Admitting: *Deleted

## 2024-03-23 DIAGNOSIS — I213 ST elevation (STEMI) myocardial infarction of unspecified site: Secondary | ICD-10-CM | POA: Diagnosis not present

## 2024-03-23 DIAGNOSIS — Z955 Presence of coronary angioplasty implant and graft: Secondary | ICD-10-CM

## 2024-03-23 DIAGNOSIS — Z48812 Encounter for surgical aftercare following surgery on the circulatory system: Secondary | ICD-10-CM | POA: Diagnosis not present

## 2024-03-23 NOTE — Progress Notes (Signed)
 Daily Session Note  Patient Details  Name: Leslie Shepard MRN: [id number] Date of Birth: [birth year] Referring Provider:   Flowsheet Row Cardiac Rehab from 02/07/2024 in [facility] Cardiac and Pulmonary Rehab  Referring Provider Dr. Veryl Gottron End       Encounter Date: 03/23/2024  Check In:  Session Check In - 03/23/24 0939       Check-In   Supervising physician immediately available to respond to emergencies See telemetry face sheet for immediately available ER MD    Location [facility]-Cardiac & Pulmonary Rehab    Staff Present Maud Sorenson, RN, BSN, CCRP;Joseph Hood RCP,RRT,BSRT;Maxon Dufur BS, Exercise Physiologist;Jason Martina Sledge RDN,LDN    Virtual Visit No    Medication changes reported     No    Fall or balance concerns reported    No    Warm-up and Cool-down Performed on first and last piece of equipment    Resistance Training Performed Yes    VAD Patient? No    PAD/SET Patient? No      Pain Assessment   Currently in Pain? No/denies                Social History   Tobacco Use  Smoking Status Never  Smokeless Tobacco Never    Goals Met:  Independence with exercise equipment Exercise tolerated well No report of concerns or symptoms today  Goals Unmet:  Not Applicable  Comments: Pt able to follow exercise prescription today without complaint.  Will continue to monitor for progression.    Dr. Firman Hughes is Medical Director for [facility] Cardiac Rehabilitation.  Dr. Fuad Aleskerov is Medical Director for [facility] Pulmonary Rehabilitation.

## 2024-03-26 ENCOUNTER — Encounter: Admitting: *Deleted

## 2024-03-26 DIAGNOSIS — I213 ST elevation (STEMI) myocardial infarction of unspecified site: Secondary | ICD-10-CM

## 2024-03-26 DIAGNOSIS — Z48812 Encounter for surgical aftercare following surgery on the circulatory system: Secondary | ICD-10-CM | POA: Diagnosis not present

## 2024-03-26 NOTE — Progress Notes (Signed)
 Daily Session Note  Patient Details  Name: Leslie Shepard MRN: [id number] Date of Birth: 03/12/1961 Referring Provider:   Flowsheet Row Cardiac Rehab from 02/07/2024 in [facility] Cardiac and Pulmonary Rehab  Referring Provider Dr. Veryl Gottron End       Encounter Date: 03/26/2024  Check In:  Session Check In - 03/26/24 0958       Check-In   Supervising physician immediately available to respond to emergencies See telemetry face sheet for immediately available ER MD    Location [facility]-Cardiac & Pulmonary Rehab    Staff Present Freddrick Jaffe BS, ACSM CEP, Exercise Physiologist;Susanne Bice, RN, BSN, CCRP    Virtual Visit No    Medication changes reported     No    Fall or balance concerns reported    No    Warm-up and Cool-down Performed on first and last piece of equipment    Resistance Training Performed Yes    VAD Patient? No    PAD/SET Patient? No      Pain Assessment   Currently in Pain? No/denies    Multiple Pain Sites No                Social History   Tobacco Use  Smoking Status Never  Smokeless Tobacco Never    Goals Met:  Proper associated with RPD/PD & O2 Sat Independence with exercise equipment Exercise tolerated well Personal goals reviewed No report of concerns or symptoms today  Goals Unmet:  Not Applicable  Comments: Pt able to follow exercise prescription today without complaint.  Will continue to monitor for progression.   Reviewed home exercise with pt today.  Pt plans to walk, spin, and use weights for exercise.  Reviewed THR, pulse, RPE, sign and symptoms, pulse oximetery and when to call 911 or MD.  Also discussed weather considerations and indoor options.  Pt voiced understanding.   Dr. Firman Hughes is Medical Director for [facility] Cardiac Rehabilitation.  Dr. Fuad Aleskerov is Medical Director for [facility] Pulmonary Rehabilitation.

## 2024-03-28 ENCOUNTER — Encounter: Admitting: *Deleted

## 2024-03-28 ENCOUNTER — Telehealth: Payer: Self-pay | Admitting: Cardiovascular Disease

## 2024-03-28 DIAGNOSIS — I213 ST elevation (STEMI) myocardial infarction of unspecified site: Secondary | ICD-10-CM

## 2024-03-28 DIAGNOSIS — Z48812 Encounter for surgical aftercare following surgery on the circulatory system: Secondary | ICD-10-CM | POA: Diagnosis not present

## 2024-03-28 DIAGNOSIS — Z955 Presence of coronary angioplasty implant and graft: Secondary | ICD-10-CM

## 2024-03-28 NOTE — Progress Notes (Signed)
 Daily Session Note  Patient Details  Name: Leslie Shepard MRN: [id number] Date of Birth: Aug 02, 1961 Referring Provider:   Flowsheet Row Cardiac Rehab from 02/07/2024 in [facility] Cardiac and Pulmonary Rehab  Referring Provider Dr. Veryl Gottron End       Encounter Date: 03/28/2024  Check In:  Session Check In - 03/28/24 0936       Check-In   Supervising physician immediately available to respond to emergencies See telemetry face sheet for immediately available ER MD    Location [facility]-Cardiac & Pulmonary Rehab    Staff Present Maud Sorenson, RN, BSN, CCRP;Joseph Hood RCP,RRT,BSRT;Maxon [place] BS, Exercise Physiologist;Noah Tickle, BS, Exercise Physiologist    Virtual Visit No    Medication changes reported     No    Fall or balance concerns reported    No    Warm-up and Cool-down Performed on first and last piece of equipment    Resistance Training Performed Yes    VAD Patient? No    PAD/SET Patient? No      Pain Assessment   Currently in Pain? No/denies                Social History   Tobacco Use  Smoking Status Never  Smokeless Tobacco Never    Goals Met:  Independence with exercise equipment Exercise tolerated well No report of concerns or symptoms today  Goals Unmet:  Not Applicable  Comments: Pt able to follow exercise prescription today without complaint.  Will continue to monitor for progression.    Dr. Firman Hughes is Medical Director for [facility] Cardiac Rehabilitation.  Dr. Fuad Aleskerov is Medical Director for [facility] Pulmonary Rehabilitation.

## 2024-03-28 NOTE — Telephone Encounter (Signed)
 Leslie Shepard returned my call to provide more blood pressure numbers and share her concerns.  She does not actually have any concerns about being dizzy.  She states she feels much better and only reports one or two episodes of dizziness when she feels she stood up too quickly.  She also reports being well hydrated (drinking 2-3 liters per day) after being taken off of the fluid restrictions. She wanted to know if it was ok to stay off of the medication.  Her BP readings for the last week were as follows:  03/21/24 109/71 -- 51; 112/60 -- 79; 102/64 -- 55 03/22/24 102/64 -- 48 03/23/24 109/77 -- 57; 102/70 -- 52; 102/60; 100/58 03/24/24 119/75; 99/66 -- 59; 112/62 -- 68; 122/80 -- 69 03/27/24 111/78 -- 61; 111/66 -- 57

## 2024-03-28 NOTE — Telephone Encounter (Signed)
 Patient returned RN's call.

## 2024-03-30 ENCOUNTER — Encounter: Admitting: *Deleted

## 2024-03-30 ENCOUNTER — Encounter

## 2024-03-30 DIAGNOSIS — Z955 Presence of coronary angioplasty implant and graft: Secondary | ICD-10-CM

## 2024-03-30 DIAGNOSIS — I213 ST elevation (STEMI) myocardial infarction of unspecified site: Secondary | ICD-10-CM | POA: Diagnosis not present

## 2024-03-30 DIAGNOSIS — Z48812 Encounter for surgical aftercare following surgery on the circulatory system: Secondary | ICD-10-CM | POA: Diagnosis not present

## 2024-03-30 NOTE — Progress Notes (Signed)
 Daily Session Note  Patient Details  Name: Leslie Shepard MRN: [id number] Date of Birth: 1961-08-09 Referring Provider:   Flowsheet Row Cardiac Rehab from 02/07/2024 in [facility] Cardiac and Pulmonary Rehab  Referring Provider Dr. Veryl Gottron End       Encounter Date: 03/30/2024  Check In:  Session Check In - 03/30/24 0931       Check-In   Supervising physician immediately available to respond to emergencies See telemetry face sheet for immediately available ER MD    Location [facility]-Cardiac & Pulmonary Rehab    Staff Present Maud Sorenson, RN, BSN, CCRP;Joseph Hood RCP,RRT,BSRT;Maxon [place] BS, Exercise Physiologist;Noah Tickle, BS, Exercise Physiologist    Virtual Visit No    Medication changes reported     No    Fall or balance concerns reported    No    Warm-up and Cool-down Performed on first and last piece of equipment    Resistance Training Performed Yes    VAD Patient? No    PAD/SET Patient? No      Pain Assessment   Currently in Pain? No/denies                Social History   Tobacco Use  Smoking Status Never  Smokeless Tobacco Never    Goals Met:  Independence with exercise equipment Exercise tolerated well No report of concerns or symptoms today  Goals Unmet:  Not Applicable  Comments: Pt able to follow exercise prescription today without complaint.  Will continue to monitor for progression.    Dr. Firman Hughes is Medical Director for [facility] Cardiac Rehabilitation.  Dr. Fuad Aleskerov is Medical Director for [facility] Pulmonary Rehabilitation.

## 2024-04-02 ENCOUNTER — Encounter: Admitting: *Deleted

## 2024-04-02 VITALS — Ht 63.9 in | Wt 126.3 lb

## 2024-04-02 DIAGNOSIS — I213 ST elevation (STEMI) myocardial infarction of unspecified site: Secondary | ICD-10-CM

## 2024-04-02 DIAGNOSIS — Z48812 Encounter for surgical aftercare following surgery on the circulatory system: Secondary | ICD-10-CM | POA: Diagnosis not present

## 2024-04-02 DIAGNOSIS — Z955 Presence of coronary angioplasty implant and graft: Secondary | ICD-10-CM

## 2024-04-02 NOTE — Progress Notes (Signed)
 Daily Session Note  Patient Details  Name: Leslie Shepard MRN: [id number] Date of Birth: Jul 13, 1961 Referring Provider:   Flowsheet Row Cardiac Rehab from 02/07/2024 in [facility] Cardiac and Pulmonary Rehab  Referring Provider Dr. Veryl Gottron End       Encounter Date: 04/02/2024  Check In:  Session Check In - 04/02/24 0915       Check-In   Supervising physician immediately available to respond to emergencies See telemetry face sheet for immediately available ER MD    Location [facility]-Cardiac & Pulmonary Rehab    Staff Present Maud Sorenson, RN, BSN, CCRP;Noah Tickle, BS, Exercise Physiologist;Kelly Sabra Cramp BS, ACSM CEP, Exercise Physiologist;Jason Martina Sledge RDN,LDN    Virtual Visit No    Medication changes reported     No    Fall or balance concerns reported    No    Warm-up and Cool-down Performed on first and last piece of equipment    Resistance Training Performed Yes    VAD Patient? No    PAD/SET Patient? No      Pain Assessment   Currently in Pain? No/denies                Social History   Tobacco Use  Smoking Status Never  Smokeless Tobacco Never    Goals Met:  Independence with exercise equipment Exercise tolerated well No report of concerns or symptoms today  Goals Unmet:  Not Applicable  Comments: Pt able to follow exercise prescription today without complaint.  Will continue to monitor for progression.   6 Minute Walk     Row Name 02/07/24 1532 04/02/24 0933       6 Minute Walk   Phase Initial Discharge    Distance 1620 feet 2110 feet    Distance % Change -- 30 %    Distance Feet Change -- 490 ft    Walk Time 6 minutes 6 minutes    # of Rest Breaks 0 0    MPH 3.07 4    METS 4.1 5.03    RPE 9 11    Perceived Dyspnea  0 0    VO2 Peak 14.4 17.61    Symptoms No No    Resting HR 63 bpm 57 bpm    Resting BP 118/64 122/68    Resting Oxygen Saturation  97 % --    Exercise Oxygen Saturation  during 6 min walk 99 % --    Max Ex. HR 96 bpm 91 bpm    Max  Ex. BP 138/68 150/70    2 Minute Post BP 122/66 128/72                Dr. Firman Hughes is Medical Director for [facility] Cardiac Rehabilitation.  Dr. Fuad Aleskerov is Medical Director for [facility] Pulmonary Rehabilitation.

## 2024-04-02 NOTE — Patient Instructions (Signed)
 Discharge Patient Instructions  Patient Details  Name: Leslie Shepard MRN: [id number] Date of Birth: Mar 13, 1961 Referring Provider:  Shayne Demark, MD   Number of Visits: 38  Reason for Discharge:  Patient reached a stable level of exercise. Patient independent in their exercise. Patient has met program and personal goals.  Smoking History:  Social History   Tobacco Use  Smoking Status Never  Smokeless Tobacco Never    Diagnosis:  ST elevation myocardial infarction (STEMI), unspecified artery (HCC)  Status post coronary artery stent placement  Initial Exercise Prescription:  Initial Exercise Prescription - 02/07/24 1500       Date of Initial Exercise RX and Referring Provider   Date 02/07/24    Referring Provider Dr. Veryl Gottron End      Oxygen   Maintain Oxygen Saturation 88% or higher      Treadmill   MPH 3    Grade 2    Minutes 15    METs 4.1      Elliptical   Level 1    Speed 3    Minutes 15    METs 4.1      REL-XR   Level 4    Watts 25    Speed 50    Minutes 15    METs 4.1      Intensity   THRR 40-80% of Max Heartrate 101-139    Ratings of Perceived Exertion 11-13    Perceived Dyspnea 0-4      Resistance Training   Training Prescription Yes    Weight 4lb    Reps 10-15             Discharge Exercise Prescription (Final Exercise Prescription Changes):  Exercise Prescription Changes - 03/29/24 1500       Response to Exercise   Blood Pressure (Admit) 102/60    Blood Pressure (Exit) 100/58    Heart Rate (Admit) 49 bpm    Heart Rate (Exercise) 133 bpm    Heart Rate (Exit) 94 bpm    Rating of Perceived Exertion (Exercise) 16    Symptoms none    Duration Continue with 30 min of aerobic exercise without signs/symptoms of physical distress.    Intensity THRR unchanged      Progression   Progression Continue to progress workloads to maintain intensity without signs/symptoms of physical distress.    Average METs 9.3       Resistance Training   Training Prescription Yes    Weight 8 lb    Reps 10-15      Interval Training   Interval Training Yes    Equipment Elliptical    Comments 1.60min intervals up to level 17      Treadmill   MPH 4.3    Grade 15    Minutes 15    METs 13.18      Elliptical   Level 17    Speed 2.9    Minutes 15    METs 5.2      Rower   Level 10    Watts 90    Minutes 15    METs 8.75      Home Exercise Plan   Plans to continue exercise at Home (comment)   walk, spin bike, and weights.   Frequency Add 2 additional days to program exercise sessions.    Initial Home Exercises Provided 03/26/24      Oxygen   Maintain Oxygen Saturation 88% or higher  Functional Capacity:  6 Minute Walk     Row Name 02/07/24 1532 04/02/24 0933       6 Minute Walk   Phase Initial Discharge    Distance 1620 feet 2110 feet    Distance % Change -- 30 %    Distance Feet Change -- 490 ft    Walk Time 6 minutes 6 minutes    # of Rest Breaks 0 0    MPH 3.07 4    METS 4.1 5.03    RPE 9 11    Perceived Dyspnea  0 0    VO2 Peak 14.4 17.61    Symptoms No No    Resting HR 63 bpm 57 bpm    Resting BP 118/64 122/68    Resting Oxygen Saturation  97 % --    Exercise Oxygen Saturation  during 6 min walk 99 % --    Max Ex. HR 96 bpm 91 bpm    Max Ex. BP 138/68 150/70    2 Minute Post BP 122/66 128/72             Nutrition & Weight - Outcomes:  Pre Biometrics - 02/07/24 1536       Pre Biometrics   Height 5' 3.94" (1.624 m)    Weight 130 lb 11.2 oz (59.3 kg)    Waist Circumference 28 inches    Hip Circumference 35 inches    Waist to Hip Ratio 0.8 %    BMI (Calculated) 22.48    Single Leg Stand 30 seconds             Post Biometrics - 04/02/24 0934        Post  Biometrics   Height 5' 3.9" (1.623 m)    Weight 126 lb 4.8 oz (57.3 kg)    Waist Circumference 29 inches    Hip Circumference 35 inches    Waist to Hip Ratio 0.83 %    BMI (Calculated) 21.75     Single Leg Stand 30 seconds              Goals reviewed with patient; copy given to patient.

## 2024-04-04 ENCOUNTER — Encounter: Admitting: *Deleted

## 2024-04-04 ENCOUNTER — Other Ambulatory Visit

## 2024-04-04 DIAGNOSIS — Z48812 Encounter for surgical aftercare following surgery on the circulatory system: Secondary | ICD-10-CM | POA: Diagnosis not present

## 2024-04-04 DIAGNOSIS — I213 ST elevation (STEMI) myocardial infarction of unspecified site: Secondary | ICD-10-CM | POA: Diagnosis not present

## 2024-04-04 DIAGNOSIS — Z955 Presence of coronary angioplasty implant and graft: Secondary | ICD-10-CM

## 2024-04-04 NOTE — Progress Notes (Signed)
 Daily Session Note  Patient Details  Name: KAROLYNE TIMMONS MRN: [id number] Date of Birth: [birth year] Referring Provider:   Flowsheet Row Cardiac Rehab from 02/07/2024 in [facility] Cardiac and Pulmonary Rehab  Referring Provider Dr. Veryl Gottron End       Encounter Date: 5/21/2025ol..  Check In:  Session Check In - 04/04/24 0940       Check-In   Supervising physician immediately available to respond to emergencies See telemetry face sheet for immediately available ER MD    Location [facility]-Cardiac & Pulmonary Rehab    Staff Present Maud Sorenson, RN, BSN, CCRP;Maxon Conetta BS, Exercise Physiologist;Noah Tickle, BS, Exercise Physiologist;Jason Martina Sledge RDN,LDN    Virtual Visit No    Medication changes reported     No    Fall or balance concerns reported    No    Warm-up and Cool-down Performed on first and last piece of equipment    Resistance Training Performed Yes    VAD Patient? No    PAD/SET Patient? No      Pain Assessment   Currently in Pain? No/denies                Social History   Tobacco Use  Smoking Status Never  Smokeless Tobacco Never    Goals Met:  Independence with exercise equipment Exercise tolerated well No report of concerns or symptoms today  Goals Unmet:  Not Applicable  Comments: Pt able to follow exercise prescription today without complaint.  Will continue to monitor for progression.    Dr. Firman Hughes is Medical Director for [facility] Cardiac Rehabilitation.  Dr. Fuad Aleskerov is Medical Director for [facility] Pulmonary Rehabilitation.

## 2024-04-05 ENCOUNTER — Encounter: Payer: Self-pay | Admitting: *Deleted

## 2024-04-06 ENCOUNTER — Encounter

## 2024-04-06 ENCOUNTER — Encounter: Admitting: *Deleted

## 2024-04-06 DIAGNOSIS — I213 ST elevation (STEMI) myocardial infarction of unspecified site: Secondary | ICD-10-CM | POA: Diagnosis not present

## 2024-04-06 DIAGNOSIS — Z955 Presence of coronary angioplasty implant and graft: Secondary | ICD-10-CM

## 2024-04-06 DIAGNOSIS — Z48812 Encounter for surgical aftercare following surgery on the circulatory system: Secondary | ICD-10-CM | POA: Diagnosis not present

## 2024-04-06 NOTE — Progress Notes (Signed)
 Daily Session Note  Patient Details  Name: Leslie Shepard MRN: [id number] Date of Birth: [birth year] Referring Provider:   Flowsheet Row Cardiac Rehab from 02/07/2024 in [facility] Cardiac and Pulmonary Rehab  Referring Provider Dr. Veryl Gottron End       Encounter Date: 04/06/2024  Check In:  Session Check In - 04/06/24 0931       Check-In   Supervising physician immediately available to respond to emergencies See telemetry face sheet for immediately available ER MD    Location [facility]-Cardiac & Pulmonary Rehab    Staff Present Maud Sorenson, RN, BSN, CCRP;Joseph Hood RCP,RRT,BSRT;Noah Tickle, [place], Exercise Physiologist;Maxon Conetta BS, Exercise Physiologist    Virtual Visit No    Medication changes reported     No    Fall or balance concerns reported    No    Warm-up and Cool-down Performed on first and last piece of equipment    Resistance Training Performed Yes    VAD Patient? No    PAD/SET Patient? No      Pain Assessment   Currently in Pain? No/denies                Social History   Tobacco Use  Smoking Status Never  Smokeless Tobacco Never    Goals Met:  Independence with exercise equipment Exercise tolerated well Personal goals reviewed No report of concerns or symptoms today  Goals Unmet:  Not Applicable  Comments: Pt able to follow exercise prescription today without complaint.    [name] graduated today from  rehab with 32 sessions completed.  Details of the patient's exercise prescription and what She needs to do in order to continue the prescription and progress were discussed with patient.  Patient was given a copy of prescription and goals.  Patient verbalized understanding. [name] plans to continue to exercise by going to [employer].   Dr. Firman Hughes is Medical Director for [facility] Cardiac Rehabilitation.  Dr. Fuad Aleskerov is Medical Director for [facility] Pulmonary Rehabilitation.

## 2024-04-06 NOTE — Progress Notes (Signed)
 Discharge Note for  Leslie Shepard     [birth year]          [name] graduated today from  rehab with 32 sessions completed.  Details of the patient's exercise prescription and what She needs to do in order to continue the prescription and progress were discussed with patient.  Patient was given a copy of prescription and goals.  Patient verbalized understanding. [name] plans to continue to exercise by going to [employer].   6 Minute Walk     Row Name 02/07/24 1532 04/02/24 0933       6 Minute Walk   Phase Initial Discharge    Distance 1620 feet 2110 feet    Distance % Change -- 30 %    Distance Feet Change -- 490 ft    Walk Time 6 minutes 6 minutes    # of Rest Breaks 0 0    MPH 3.07 4    METS 4.1 5.03    RPE 9 11    Perceived Dyspnea  0 0    VO2 Peak 14.4 17.61    Symptoms No No    Resting HR 63 bpm 57 bpm    Resting BP 118/64 122/68    Resting Oxygen Saturation  97 % --    Exercise Oxygen Saturation  during 6 min walk 99 % --    Max Ex. HR 96 bpm 91 bpm    Max Ex. BP 138/68 150/70    2 Minute Post BP 122/66 128/72

## 2024-04-06 NOTE — Progress Notes (Signed)
 Cardiac Individual Treatment Plan  Patient Details  Name: Leslie Shepard MRN: [id number] Date of Birth: 1961/01/29 Referring Provider:   Flowsheet Row Cardiac Rehab from 02/07/2024 in [facility] Cardiac and Pulmonary Rehab  Referring Provider Dr. Veryl Gottron End       Initial Encounter Date:  Flowsheet Row Cardiac Rehab from 02/07/2024 in [facility] Cardiac and Pulmonary Rehab  Date 02/07/24       Visit Diagnosis: ST elevation myocardial infarction (STEMI), unspecified artery [facility])  Status post coronary artery stent placement  Patient's Home Medications on Admission:  Current Outpatient Medications:    amiodarone  (PACERONE ) 100 MG tablet, Take 1 tablet (100 mg total) by mouth daily., Disp: 56 tablet, Rfl: 0   aspirin  81 MG chewable tablet, Chew 1 tablet (81 mg total) by mouth daily., Disp: 90 tablet, Rfl: 1   empagliflozin  (JARDIANCE ) 10 MG TABS tablet, Take 1 tablet (10 mg total) by mouth daily., Disp: 90 tablet, Rfl: 3   Evolocumab  (REPATHA  SURECLICK) 140 MG/ML SOAJ, Inject 140 mg into the skin every 14 (fourteen) days., Disp: 2 mL, Rfl: 11   ezetimibe  (ZETIA ) 10 MG tablet, Take 1 tablet (10 mg total) by mouth daily., Disp: 90 tablet, Rfl: 3   losartan  (COZAAR ) 25 MG tablet, Take 0.5 tablets (12.5 mg total) by mouth every evening., Disp: 45 tablet, Rfl: 3   metoprolol  succinate (TOPROL -XL) 25 MG 24 hr tablet, Take 0.5 tablets (12.5 mg total) by mouth daily., Disp: 45 tablet, Rfl: 3   mexiletine (MEXITIL ) 150 MG capsule, Take 1 capsule (150 mg total) by mouth every 12 (twelve) hours., Disp: 180 capsule, Rfl: 0   prasugrel  (EFFIENT ) 5 MG TABS tablet, Take 1 tablet (5 mg total) by mouth daily., Disp: 90 tablet, Rfl: 3  Past Medical History: Past Medical History:  Diagnosis Date   Hyperlipidemia    PMDD (premenstrual dysphoric disorder)     Tobacco Use: Social History   Tobacco Use  Smoking Status Never  Smokeless Tobacco Never    Labs: Review Flowsheet  More data exists       Latest Ref Rng & Units 12/24/2009 02/16/2013 01/17/2019 10/04/2023 01/11/2024  Labs for ITP Cardiac and Pulmonary Rehab  Cholestrol 0 - 200 mg/dL 469  629  528  413  244   LDL (calc) 0 - 99 mg/dL 010  - 272  536  644   Direct LDL mg/dL - 034.7  - - -  HDL-C >42 mg/dL 59.56  38.75  64.33  29.51  60   Trlycerides <150 mg/dL 88.4  166.0  630.1  601.0  68      Exercise Target Goals: Exercise Program Goal: Individual exercise prescription set using results from initial 6 min walk test and THRR while considering  patient's activity barriers and safety.   Exercise Prescription Goal: Initial exercise prescription builds to 30-45 minutes a day of aerobic activity, 2-3 days per week.  Home exercise guidelines will be given to patient during program as part of exercise prescription that the participant will acknowledge.   Education: Aerobic Exercise: - Group verbal and visual presentation on the components of exercise prescription. Introduces F.I.T.T principle from ACSM for exercise prescriptions.  Reviews F.I.T.T. principles of aerobic exercise including progression. Written material given at graduation. Flowsheet Row Cardiac Rehab from 03/21/2024 in [facility] Cardiac and Pulmonary Rehab  Education need identified 02/07/24       Education: Resistance Exercise: - Group verbal and visual presentation on the components of exercise prescription. Introduces F.I.T.T principle from ACSM for  exercise prescriptions  Reviews F.I.T.T. principles of resistance exercise including progression. Written material given at graduation.    Education: Exercise & Equipment Safety: - Individual verbal instruction and demonstration of equipment use and safety with use of the equipment. Flowsheet Row Cardiac Rehab from 03/21/2024 in [facility] Cardiac and Pulmonary Rehab  Date 02/07/24  Educator [facility]  Instruction Review Code 1- Verbalizes Understanding       Education: Exercise Physiology & General Exercise Guidelines: - Group verbal  and written instruction with models to review the exercise physiology of the cardiovascular system and associated critical values. Provides general exercise guidelines with specific guidelines to those with heart or lung disease.    Education: Flexibility, Balance, Mind/Body Relaxation: - Group verbal and visual presentation with interactive activity on the components of exercise prescription. Introduces F.I.T.T principle from ACSM for exercise prescriptions. Reviews F.I.T.T. principles of flexibility and balance exercise training including progression. Also discusses the mind body connection.  Reviews various relaxation techniques to help reduce and manage stress (i.e. Deep breathing, progressive muscle relaxation, and visualization). Balance handout provided to take home. Written material given at graduation.   Activity Barriers & Risk Stratification:  Activity Barriers & Cardiac Risk Stratification - 02/07/24 1533       Activity Barriers & Cardiac Risk Stratification   Activity Barriers None    Cardiac Risk Stratification High             6 Minute Walk:  6 Minute Walk     Row Name 02/07/24 1532 04/02/24 0933       6 Minute Walk   Phase Initial Discharge    Distance 1620 feet 2110 feet    Distance % Change -- 30 %    Distance Feet Change -- 490 ft    Walk Time 6 minutes 6 minutes    # of Rest Breaks 0 0    MPH 3.07 4    METS 4.1 5.03    RPE 9 11    Perceived Dyspnea  0 0    VO2 Peak 14.4 17.61    Symptoms No No    Resting HR 63 bpm 57 bpm    Resting BP 118/64 122/68    Resting Oxygen Saturation  97 % --    Exercise Oxygen Saturation  during 6 min walk 99 % --    Max Ex. HR 96 bpm 91 bpm    Max Ex. BP 138/68 150/70    2 Minute Post BP 122/66 128/72             Oxygen Initial Assessment:   Oxygen Re-Evaluation:   Oxygen Discharge (Final Oxygen Re-Evaluation):   Initial Exercise Prescription:  Initial Exercise Prescription - 02/07/24 1500       Date  of Initial Exercise RX and Referring Provider   Date 02/07/24    Referring Provider Dr. Veryl Gottron End      Oxygen   Maintain Oxygen Saturation 88% or higher      Treadmill   MPH 3    Grade 2    Minutes 15    METs 4.1      Elliptical   Level 1    Speed 3    Minutes 15    METs 4.1      REL-XR   Level 4    Watts 25    Speed 50    Minutes 15    METs 4.1      Intensity   THRR 40-80% of Max Heartrate 101-139  Ratings of Perceived Exertion 11-13    Perceived Dyspnea 0-4      Resistance Training   Training Prescription Yes    Weight 4lb    Reps 10-15             Perform Capillary Blood Glucose checks as needed.  Exercise Prescription Changes:   Exercise Prescription Changes     Row Name 02/07/24 1500 02/08/24 0900 02/16/24 0800 02/29/24 1300 03/15/24 1600     Response to Exercise   Blood Pressure (Admit) 118/64 118/64 102/64 118/66 102/56   Blood Pressure (Exercise) 138/68 138/68 138/58 144/72 164/70   Blood Pressure (Exit) 122/66 122/66 104/56 92/60 112/62   Heart Rate (Admit) 63 bpm 63 bpm 53 bpm 59 bpm 59 bpm   Heart Rate (Exercise) 96 bpm 96 bpm 114 bpm 129 bpm 130 bpm   Heart Rate (Exit) 59 bpm 59 bpm 70 bpm 79 bpm 84 bpm   Oxygen Saturation (Admit) 97 % 97 % -- -- --   Oxygen Saturation (Exercise) 99 % 99 % -- -- --   Oxygen Saturation (Exit) 99 % 99 % -- -- --   Rating of Perceived Exertion (Exercise) 9 9 13 15 15    Perceived Dyspnea (Exercise) 0 0 -- 0 --   Symptoms none none none none none   Comments results results First 2 exercise sessions First 2 exercise sessions --   Duration -- -- Continue with 30 min of aerobic exercise without signs/symptoms of physical distress. Continue with 30 min of aerobic exercise without signs/symptoms of physical distress. Continue with 30 min of aerobic exercise without signs/symptoms of physical distress.   Intensity -- -- THRR unchanged THRR unchanged THRR unchanged     Progression   Progression --  -- Continue to progress workloads to maintain intensity without signs/symptoms of physical distress. Continue to progress workloads to maintain intensity without signs/symptoms of physical distress. Continue to progress workloads to maintain intensity without signs/symptoms of physical distress.   Average METs -- -- 4.01 6.4 7.14     Resistance Training   Training Prescription -- Yes Yes Yes Yes   Weight -- 4lb 4lb 4lb 8 lb   Reps -- 10-15 10-15 10-15 10-15     Interval Training   Interval Training -- -- No No No     Treadmill   MPH -- 3 3 4.4 4.2   Grade -- 2 2 6 14    Minutes -- 15 15 15 15    METs -- 4.1 4.12 8.01 13.4     Elliptical   Level -- 1 2 6 9    Speed -- 3 3.5 4 3    Minutes -- 15 15 15 15    METs -- 4.1 3.8 6.4 6.4     REL-XR   Level -- 4 4 12 12    Watts -- 25 -- -- --   Speed -- 50 -- -- --   Minutes -- 15 15 15 15    METs -- 4.1 -- 10.1 9     Rower   Level -- -- -- -- 10   Watts -- -- -- -- 20   Minutes -- -- -- -- 15   METs -- -- -- -- 4.17     Oxygen   Maintain Oxygen Saturation -- -- 88% or higher 88% or higher 88% or higher    Row Name 03/26/24 1000 03/29/24 1500           Response to Exercise   Blood Pressure (Admit) --  102/60      Blood Pressure (Exit) -- 100/58      Heart Rate (Admit) -- 49 bpm      Heart Rate (Exercise) -- 133 bpm      Heart Rate (Exit) -- 94 bpm      Rating of Perceived Exertion (Exercise) -- 16      Symptoms -- none      Duration Continue with 30 min of aerobic exercise without signs/symptoms of physical distress. Continue with 30 min of aerobic exercise without signs/symptoms of physical distress.      Intensity THRR unchanged THRR unchanged        Progression   Progression Continue to progress workloads to maintain intensity without signs/symptoms of physical distress. Continue to progress workloads to maintain intensity without signs/symptoms of physical distress.      Average METs 7.14 9.3        Resistance Training    Training Prescription Yes Yes      Weight 8 lb 8 lb      Reps 10-15 10-15        Interval Training   Interval Training No Yes      Equipment -- Elliptical      Comments -- 1.109min intervals up to level 17        Treadmill   MPH 4.2 4.3      Grade 14 15      Minutes 15 15      METs 13.4 13.18        Elliptical   Level 9 17      Speed 3 2.9      Minutes 15 15      METs 6.4 5.2        REL-XR   Level 12 --      Minutes 15 --      METs 9 --        Rower   Level 10 10      Watts 20 90      Minutes 15 15      METs 4.17 8.75        Home Exercise Plan   Plans to continue exercise at Home (comment)  walk, spin bike, and weights. Home (comment)  walk, spin bike, and weights.      Frequency Add 2 additional days to program exercise sessions. Add 2 additional days to program exercise sessions.      Initial Home Exercises Provided 03/26/24 03/26/24        Oxygen   Maintain Oxygen Saturation 88% or higher 88% or higher               Exercise Comments:   Exercise Comments     Row Name 02/08/24 0946 04/06/24 0933         Exercise Comments First full day of exercise!  Patient was oriented to gym and equipment including functions, settings, policies, and procedures.  Patient's individual exercise prescription and treatment plan were reviewed.  All starting workloads were established based on the results of the 6 minute walk test done at initial orientation visit.  The plan for exercise progression was also introduced and progression will be customized based on patient's performance and goals. Leslie Shepard graduated today from  rehab with 32 sessions completed.  Details of the patient's exercise prescription and what She needs to do in order to continue the prescription and progress were discussed with patient.  Patient was given a copy of prescription and goals.  Patient  verbalized understanding. Leslie Shepard plans to continue to exercise by going to [employer].               Exercise  Goals and Review:   Exercise Goals     Row Name 02/07/24 1536             Exercise Goals   Increase Physical Activity Yes       Intervention Provide advice, education, support and counseling about physical activity/exercise needs.;Develop an individualized exercise prescription for aerobic and resistive training based on initial evaluation findings, risk stratification, comorbidities and participant's personal goals.       Expected Outcomes Short Term: Attend rehab on a regular basis to increase amount of physical activity.;Long Term: Exercising regularly at least 3-5 days a week.;Long Term: Add in home exercise to make exercise part of routine and to increase amount of physical activity.       Increase Strength and Stamina Yes       Intervention Develop an individualized exercise prescription for aerobic and resistive training based on initial evaluation findings, risk stratification, comorbidities and participant's personal goals.;Provide advice, education, support and counseling about physical activity/exercise needs.       Expected Outcomes Long Term: Improve cardiorespiratory fitness, muscular endurance and strength as measured by increased METs and functional capacity ( );Short Term: Perform resistance training exercises routinely during rehab and add in resistance training at home;Short Term: Increase workloads from initial exercise prescription for resistance, speed, and METs.       Able to understand and use rate of perceived exertion (RPE) scale Yes       Intervention Provide education and explanation on how to use RPE scale       Expected Outcomes Long Term:  Able to use RPE to guide intensity level when exercising independently;Short Term: Able to use RPE daily in rehab to express subjective intensity level       Able to understand and use Dyspnea scale Yes       Intervention Provide education and explanation on how to use Dyspnea scale       Expected Outcomes Short Term: Able to  use Dyspnea scale daily in rehab to express subjective sense of shortness of breath during exertion;Long Term: Able to use Dyspnea scale to guide intensity level when exercising independently       Knowledge and understanding of Target Heart Rate Range (THRR) Yes       Intervention Provide education and explanation of THRR including how the numbers were predicted and where they are located for reference       Expected Outcomes Long Term: Able to use THRR to govern intensity when exercising independently;Short Term: Able to use daily as guideline for intensity in rehab;Short Term: Able to state/look up THRR       Able to check pulse independently Yes       Intervention Review the importance of being able to check your own pulse for safety during independent exercise;Provide education and demonstration on how to check pulse in carotid and radial arteries.       Expected Outcomes Long Term: Able to check pulse independently and accurately;Short Term: Able to explain why pulse checking is important during independent exercise       Understanding of Exercise Prescription Yes       Intervention Provide education, explanation, and written materials on patient's individual exercise prescription       Expected Outcomes Long Term: Able to explain home exercise prescription to exercise independently;Short  Term: Able to explain program exercise prescription                Exercise Goals Re-Evaluation :  Exercise Goals Re-Evaluation     Row Name 02/08/24 0946 02/16/24 0813 02/29/24 1341 03/12/24 0936 03/15/24 1613     Exercise Goal Re-Evaluation   Exercise Goals Review Able to understand and use rate of perceived exertion (RPE) scale;Able to understand and use Dyspnea scale;Knowledge and understanding of Target Heart Rate Range (THRR);Understanding of Exercise Prescription Increase Physical Activity;Understanding of Exercise Prescription;Increase Strength and Stamina Increase Physical  Activity;Understanding of Exercise Prescription;Increase Strength and Stamina Increase Physical Activity;Increase Strength and Stamina;Understanding of Exercise Prescription Increase Physical Activity;Increase Strength and Stamina;Understanding of Exercise Prescription   Comments Reviewed RPE and dyspnea scale, THR and program prescription with pt today.  Pt voiced understanding and was given a copy of goals to take home. Leslie Shepard is off to a good start in the program. She has tolerated her exercise prescription very well. She did well with her treadmill workload of a speed of 3 mph and incline of 2%. She also did well with level 4 on the XR. She already increased her workload on the elliptical at level 2 with a speed of 3.5 mph. We will continue to monitor her progress in the program. Leslie Shepard continues to do well in rehab. She was recently able to increase her workload on the treadmill to a speed of 4.4 mph and 6% incline. She was able able to increase from level 4 to 12 on the XR, and increase from level 2 to 6 on the elliptical. We will continue to monitor her progress in the program. Leslie Shepard is doing very well at rehab and at home. She was very active before her heart event. She is still active but not as much as she was before. She is excited that she can still hike and exercise regularly. She got a fit bit to monitor her heart rate during exercise. Leslie Shepard continues to do well in rehab. She recently began using the rowing machine and did well at level 10. She also increased her treadmill workload by increasing her incline to 14% while maintaining a speed of 4.2 mph. She also increased to level 9 on the elliptical and increased to 8 lb hand weights for resistance training. We will continue to monitor her progress in the program.   Expected Outcomes Short: Use RPE daily to regulate intensity. Long: Follow program prescription in THR. Short: Continue to progressively increase workloads when appropriate. Long: Continue  exercise to improve strength and stamina. Short: Continue to progressively increase workloads when appropriate. Long: Continue exercise to improve strength and stamina. STG: increase workload as able, continue to exercise outside of rehab. LTG: Continue exercise to improve strength and stamina. Short: Continue to progressively increase treadmill workload. Long: Continue exercise to improve strength and stamina.    Row Name 03/26/24 1001 03/29/24 1511           Exercise Goal Re-Evaluation   Exercise Goals Review Increase Physical Activity;Able to understand and use rate of perceived exertion (RPE) scale;Knowledge and understanding of Target Heart Rate Range (THRR);Understanding of Exercise Prescription;Increase Strength and Stamina;Able to understand and use Dyspnea scale;Able to check pulse independently Increase Physical Activity;Increase Strength and Stamina;Understanding of Exercise Prescription      Comments Reviewed home exercise with pt today.  Pt plans to walk, spin, and use weights for exercise.  Reviewed THR, pulse, RPE, sign and symptoms, pulse oximetery and when to call  911 or MD.  Also discussed weather considerations and indoor options.  Pt voiced understanding. Leslie Shepard is doing well in rehab. She was able to use interval training on the elliptical and reach a peak level of 17. She was also able to increase her treadmill workload to a speed of 4. and an incline of 15%. We will continue to monitor her progress in the program.      Expected Outcomes Short: add 1-2 days a week of home exericse. Long: maintain independent exercise routine. Short: Continue to increase treadmill and elliptical workloads. Long: Continue exercise to improve strength and stamina.               Discharge Exercise Prescription (Final Exercise Prescription Changes):  Exercise Prescription Changes - 03/29/24 1500       Response to Exercise   Blood Pressure (Admit) 102/60    Blood Pressure (Exit) 100/58     Heart Rate (Admit) 49 bpm    Heart Rate (Exercise) 133 bpm    Heart Rate (Exit) 94 bpm    Rating of Perceived Exertion (Exercise) 16    Symptoms none    Duration Continue with 30 min of aerobic exercise without signs/symptoms of physical distress.    Intensity THRR unchanged      Progression   Progression Continue to progress workloads to maintain intensity without signs/symptoms of physical distress.    Average METs 9.3      Resistance Training   Training Prescription Yes    Weight 8 lb    Reps 10-15      Interval Training   Interval Training Yes    Equipment Elliptical    Comments 1.26min intervals up to level 17      Treadmill   MPH 4.3    Grade 15    Minutes 15    METs 13.18      Elliptical   Level 17    Speed 2.9    Minutes 15    METs 5.2      Rower   Level 10    Watts 90    Minutes 15    METs 8.75      Home Exercise Plan   Plans to continue exercise at Home (comment)   walk, spin bike, and weights.   Frequency Add 2 additional days to program exercise sessions.    Initial Home Exercises Provided 03/26/24      Oxygen   Maintain Oxygen Saturation 88% or higher             Nutrition:  Target Goals: Understanding of nutrition guidelines, daily intake of sodium 1500mg , cholesterol 200mg , calories 30% from fat and 7% or less from saturated fats, daily to have 5 or more servings of fruits and vegetables.  Education: All About Nutrition: -Group instruction provided by verbal, written material, interactive activities, discussions, models, and posters to present general guidelines for heart healthy nutrition including fat, fiber, MyPlate, the role of sodium in heart healthy nutrition, utilization of the nutrition label, and utilization of this knowledge for meal planning. Follow up email sent as well. Written material given at graduation. Flowsheet Row Cardiac Rehab from 03/21/2024 in [facility] Cardiac and Pulmonary Rehab  Date 02/15/24  Educator JG part 2   Instruction Review Code 1- Verbalizes Understanding       Biometrics:  Pre Biometrics - 02/07/24 1536       Pre Biometrics   Height 5' 3.94" (1.624 m)    Weight 130 lb 11.2 oz (59.3  kg)    Waist Circumference 28 inches    Hip Circumference 35 inches    Waist to Hip Ratio 0.8 %    BMI (Calculated) 22.48    Single Leg Stand 30 seconds             Post Biometrics - 04/02/24 0934        Post  Biometrics   Height 5' 3.9" (1.623 m)    Weight 126 lb 4.8 oz (57.3 kg)    Waist Circumference 29 inches    Hip Circumference 35 inches    Waist to Hip Ratio 0.83 %    BMI (Calculated) 21.75    Single Leg Stand 30 seconds             Nutrition Therapy Plan and Nutrition Goals:   Nutrition Assessments:  MEDIFICTS Score Key: >=70 Need to make dietary changes  40-70 Heart Healthy Diet <= 40 Therapeutic Level Cholesterol Diet  Flowsheet Row Documentation from 04/05/2024 in [facility] Cardiac and Pulmonary Rehab  Picture Your Plate Total Score on Discharge 83      Picture Your Plate Scores: <60 Unhealthy dietary pattern with much room for improvement. 41-50 Dietary pattern unlikely to meet recommendations for good health and room for improvement. 51-60 More healthful dietary pattern, with some room for improvement.  >60 Healthy dietary pattern, although there may be some specific behaviors that could be improved.    Nutrition Goals Re-Evaluation:  Nutrition Goals Re-Evaluation     Row Name 03/12/24 0943             Goals   Comment Leslie Shepard has been making changes since December of 2024 towards a healthier diet. She reports she was very strict and felt defeated at times when some days did not work out how she planned. But she has been reading some nutrition books and has found a better mindset towards long term susstainable heart health lifestyle changes. Encouraged and supported her towards making better food choices and eating a more balanced diet that meets needs and  allows her to live her life with less stress about every food choice.       Expected Outcome STG: Continue to read labels and keep sodium below 1500mg  daily. LTG: Follow a heart healthy lifestyle                Nutrition Goals Discharge (Final Nutrition Goals Re-Evaluation):  Nutrition Goals Re-Evaluation - 03/12/24 0943       Goals   Comment Leslie Shepard has been making changes since December of 2024 towards a healthier diet. She reports she was very strict and felt defeated at times when some days did not work out how she planned. But she has been reading some nutrition books and has found a better mindset towards long term susstainable heart health lifestyle changes. Encouraged and supported her towards making better food choices and eating a more balanced diet that meets needs and allows her to live her life with less stress about every food choice.    Expected Outcome STG: Continue to read labels and keep sodium below 1500mg  daily. LTG: Follow a heart healthy lifestyle             Psychosocial: Target Goals: Acknowledge presence or absence of significant depression and/or stress, maximize coping skills, provide positive support system. Participant is able to verbalize types and ability to use techniques and skills needed for reducing stress and depression.   Education: Stress, Anxiety, and Depression - Group verbal and visual  presentation to define topics covered.  Reviews how body is impacted by stress, anxiety, and depression.  Also discusses healthy ways to reduce stress and to treat/manage anxiety and depression.  Written material given at graduation. Flowsheet Row Cardiac Rehab from 03/21/2024 in [facility] Cardiac and Pulmonary Rehab  Date 03/21/24  Educator [facility]  Instruction Review Code 1- [employer] Understanding       Education: Sleep Hygiene -Provides group verbal and written instruction about how sleep can affect your health.  Define sleep hygiene, discuss sleep cycles and impact  of sleep habits. Review good sleep hygiene tips.    Initial Review & Psychosocial Screening:  Initial Psych Review & Screening - 02/02/24 1237       Initial Review   Current issues with None Identified      Family Dynamics   Good Support System? Yes   family. friends     Barriers   Psychosocial barriers to participate in program There are no identifiable barriers or psychosocial needs.      Screening Interventions   Interventions Encouraged to exercise;Provide feedback about the scores to participant;To provide support and resources with identified psychosocial needs    Expected Outcomes Short Term goal: Utilizing psychosocial counselor, staff and physician to assist with identification of specific Stressors or current issues interfering with healing process. Setting desired goal for each stressor or current issue identified.;Long Term Goal: Stressors or current issues are controlled or eliminated.;Short Term goal: Identification and review with participant of any Quality of Life or Depression concerns found by scoring the questionnaire.;Long Term goal: The participant improves quality of Life and PHQ9 Scores as seen by post scores and/or verbalization of changes             Quality of Life Scores:   Quality of Life - 04/05/24 0947       Quality of Life   Select Quality of Life   declined answering questionnaire           Scores of 19 and below usually indicate a poorer quality of life in these areas.  A difference of  2-3 points is a clinically meaningful difference.  A difference of 2-3 points in the total score of the Quality of Life Index has been associated with significant improvement in overall quality of life, self-image, physical symptoms, and general health in studies assessing change in quality of life.  PHQ-9: Review Flowsheet  More data may exist      04/05/2024 02/07/2024 10/11/2023 03/22/2023 01/17/2019  Depression screen PHQ 2/9  Decreased Interest 0 0 0 0 0   Down, Depressed, Hopeless 0 0 0 0 0  PHQ - 2 Score 0 0 0 0 0  Altered sleeping 0 1 0 0 -  Tired, decreased energy 0 1 0 0 -  Change in appetite 0 0 0 0 -  Feeling bad or failure about yourself  0 0 0 0 -  Trouble concentrating 0 0 0 0 -  Moving slowly or fidgety/restless 0 0 0 0 -  Suicidal thoughts 0 0 0 0 -  PHQ-9 Score 0 2 0 0 -  Difficult doing work/chores - Not difficult at all Not difficult at all Not difficult at all -   Interpretation of Total Score  Total Score Depression Severity:  1-4 = Minimal depression, 5-9 = Mild depression, 10-14 = Moderate depression, 15-19 = Moderately severe depression, 20-27 = Severe depression   Psychosocial Evaluation and Intervention:  Psychosocial Evaluation - 02/02/24 1245  Psychosocial Evaluation & Interventions   Interventions Encouraged to exercise with the program and follow exercise prescription    Comments Leslie Shepard is coming to Cardiac Rehab after a STEMI w/ stents. She required CPR and currently has a life vest on. She has been very active during her life and is ready to get back to hiking, biking and other outdoor activities. She states she has been healing well and feels thankful for all the support she has received from family and friends. She has no stress concerns at this time. She is very motivated to get started in the program and is already walking some. She wants to work towards hopefully going hiking during her trip to [place] that she has planned in June.    Expected Outcomes Short: attend cardiac and pulmonary rehab. Long: develop and maintain positive self care habits.    Continue Psychosocial Services  Follow up required by staff             Psychosocial Re-Evaluation:  Psychosocial Re-Evaluation     Row Name 03/12/24 [phone]             Psychosocial Re-Evaluation   Current issues with Current Sleep Concerns       Comments Leslie Shepard reports she is doing well mentally. Denies any anxiety, depression or  stress. She has always struggled with sleep, even before the stents were placed. She gets some good nights of sleep but says every third or fouth night she will sleep very poorly.       Expected Outcomes STG: Continue to focus on good sleep. LTG: Achieve and maintain a positive outlook on health and daily life       Interventions Encouraged to attend Cardiac Rehabilitation for the exercise       Continue Psychosocial Services  Follow up required by staff                Psychosocial Discharge (Final Psychosocial Re-Evaluation):  Psychosocial Re-Evaluation - 03/12/24 1478       Psychosocial Re-Evaluation   Current issues with Current Sleep Concerns    Comments Leslie Shepard reports she is doing well mentally. Denies any anxiety, depression or stress. She has always struggled with sleep, even before the stents were placed. She gets some good nights of sleep but says every third or fouth night she will sleep very poorly.    Expected Outcomes STG: Continue to focus on good sleep. LTG: Achieve and maintain a positive outlook on health and daily life    Interventions Encouraged to attend Cardiac Rehabilitation for the exercise    Continue Psychosocial Services  Follow up required by staff             Vocational Rehabilitation: Provide vocational rehab assistance to qualifying candidates.   Vocational Rehab Evaluation & Intervention:  Vocational Rehab - 02/02/24 1236       Initial Vocational Rehab Evaluation & Intervention   Assessment shows need for Vocational Rehabilitation No             Education: Education Goals: Education classes will be provided on a variety of topics geared toward better understanding of heart health and risk factor modification. Participant will state understanding/return demonstration of topics presented as noted by education test scores.  Learning Barriers/Preferences:  Learning Barriers/Preferences - 02/02/24 1236       Learning Barriers/Preferences    Learning Barriers None    Learning Preferences None             General Cardiac  Education Topics:  AED/CPR: - Group verbal and written instruction with the use of models to demonstrate the basic use of the AED with the basic ABC's of resuscitation.   Anatomy and Cardiac Procedures: - Group verbal and visual presentation and models provide information about basic cardiac anatomy and function. Reviews the testing methods done to diagnose heart disease and the outcomes of the test results. Describes the treatment choices: Medical Management, Angioplasty, or Coronary Bypass Surgery for treating various heart conditions including Myocardial Infarction, Angina, Valve Disease, and Cardiac Arrhythmias.  Written material given at graduation. Flowsheet Row Cardiac Rehab from 03/21/2024 in [facility] Cardiac and Pulmonary Rehab  Education need identified 02/07/24  Date 02/22/24  Educator sb  Instruction Review Code 1- Verbalizes Understanding       Medication Safety: - Group verbal and visual instruction to review commonly prescribed medications for heart and lung disease. Reviews the medication, class of the drug, and side effects. Includes the steps to properly store meds and maintain the prescription regimen.  Written material given at graduation. Flowsheet Row Cardiac Rehab from 03/21/2024 in [facility] Cardiac and Pulmonary Rehab  Date 02/29/24  Educator SB  Instruction Review Code 1- Verbalizes Understanding       Intimacy: - Group verbal instruction through game format to discuss how heart and lung disease can affect sexual intimacy. Written material given at graduation..   Know Your Numbers and Heart Failure: - Group verbal and visual instruction to discuss disease risk factors for cardiac and pulmonary disease and treatment options.  Reviews associated critical values for Overweight/Obesity, Hypertension, Cholesterol, and Diabetes.  Discusses basics of heart failure: signs/symptoms and  treatments.  Introduces Heart Failure Zone chart for action plan for heart failure.  Written material given at graduation. Flowsheet Row Cardiac Rehab from 03/21/2024 in [facility] Cardiac and Pulmonary Rehab  Date 03/07/24  Educator SB  Instruction Review Code 1- Verbalizes Understanding       Infection Prevention: - Provides verbal and written material to individual with discussion of infection control including proper hand washing and proper equipment cleaning during exercise session. Flowsheet Row Cardiac Rehab from 03/21/2024 in [facility] Cardiac and Pulmonary Rehab  Date 02/07/24  Educator [facility]  Instruction Review Code 1- Verbalizes Understanding       Falls Prevention: - Provides verbal and written material to individual with discussion of falls prevention and safety. Flowsheet Row Cardiac Rehab from 03/21/2024 in [facility] Cardiac and Pulmonary Rehab  Date 02/07/24  Educator [facility]  Instruction Review Code 1- Verbalizes Understanding       Other: -Provides group and verbal instruction on various topics (see comments)   Knowledge Questionnaire Score:  Knowledge Questionnaire Score - 04/05/24 0947       Knowledge Questionnaire Score   Post Score 26/26             Core Components/Risk Factors/Patient Goals at Admission:  Personal Goals and Risk Factors at Admission - 02/02/24 1234       Core Components/Risk Factors/Patient Goals on Admission   Heart Failure Yes    Intervention Provide a combined exercise and nutrition program that is supplemented with education, support and counseling about heart failure. Directed toward relieving symptoms such as shortness of breath, decreased exercise tolerance, and extremity edema.    Expected Outcomes Improve functional capacity of life;Short term: Attendance in program 2-3 days a week with increased exercise capacity. Reported lower sodium intake. Reported increased fruit and vegetable intake. Reports medication compliance.;Short term: Daily  weights obtained and reported for  increase. Utilizing diuretic protocols set by physician.;Long term: Adoption of self-care skills and reduction of barriers for early signs and symptoms recognition and intervention leading to self-care maintenance.    Lipids Yes    Intervention Provide education and support for participant on nutrition & aerobic/resistive exercise along with prescribed medications to achieve LDL 70mg , HDL >40mg .    Expected Outcomes Short Term: Participant states understanding of desired cholesterol values and is compliant with medications prescribed. Participant is following exercise prescription and nutrition guidelines.;Long Term: Cholesterol controlled with medications as prescribed, with individualized exercise RX and with personalized nutrition plan. Value goals: LDL < 70mg , HDL > 40 mg.             Education:Diabetes - Individual verbal and written instruction to review signs/symptoms of diabetes, desired ranges of glucose level fasting, after meals and with exercise. Acknowledge that pre and post exercise glucose checks will be done for 3 sessions at entry of program.   Core Components/Risk Factors/Patient Goals Review:   Goals and Risk Factor Review     Row Name 03/12/24 0947             Core Components/Risk Factors/Patient Goals Review   Personal Goals Review Hypertension       Review Leslie Shepard reports she is monitoring her heart rate during exercise and checking her blood pressure at home, taking her medications as prescribed. Commended her on her progress and commitment towards the program.       Expected Outcomes STG: Continue to check BP and HR at home and ensure it is similar to readings here at rehab. LTG: Manage health risks independently                Core Components/Risk Factors/Patient Goals at Discharge (Final Review):   Goals and Risk Factor Review - 03/12/24 0947       Core Components/Risk Factors/Patient Goals Review   Personal Goals  Review Hypertension    Review Leslie Shepard reports she is monitoring her heart rate during exercise and checking her blood pressure at home, taking her medications as prescribed. Commended her on her progress and commitment towards the program.    Expected Outcomes STG: Continue to check BP and HR at home and ensure it is similar to readings here at rehab. LTG: Manage health risks independently             ITP Comments:  ITP Comments     Row Name 02/02/24 1231 02/07/24 1532 02/08/24 0946 02/22/24 0946 03/21/24 1042   ITP Comments Initial phone call completed. Diagnosis can be found in [facility] 2/25. EP Orientation scheduled for Tuesday 3/25 at 1:30. Completed and gym orientation. Initial ITP created and sent for review to Dr. Firman Hughes, Medical Director. First full day of exercise!  Patient was oriented to gym and equipment including functions, settings, policies, and procedures.  Patient's individual exercise prescription and treatment plan were reviewed.  All starting workloads were established based on the results of the 6 minute walk test done at initial orientation visit.  The plan for exercise progression was also introduced and progression will be customized based on patient's performance and goals. 30 Day review completed. Medical Director ITP review done, changes made as directed, and signed approval by Medical Director.    new to program 30 Day review completed. Medical Director ITP review done, changes made as directed, and signed approval by Medical Director.    Row Name 04/06/24 [id number]  ITP Comments Leslie Shepard graduated today from  rehab with 32 sessions completed.  Details of the patient's exercise prescription and what She needs to do in order to continue the prescription and progress were discussed with patient.  Patient was given a copy of prescription and goals.  Patient verbalized understanding. Leslie Shepard plans to continue to exercise by going to [employer].                 Comments: Discharge ITP

## 2024-04-11 ENCOUNTER — Encounter

## 2024-04-13 ENCOUNTER — Encounter

## 2024-04-16 ENCOUNTER — Encounter

## 2024-04-18 ENCOUNTER — Encounter

## 2024-04-20 ENCOUNTER — Encounter

## 2024-04-23 ENCOUNTER — Encounter

## 2024-04-25 ENCOUNTER — Encounter

## 2024-04-27 ENCOUNTER — Encounter

## 2024-04-30 ENCOUNTER — Encounter

## 2024-05-02 ENCOUNTER — Encounter

## 2024-05-04 ENCOUNTER — Encounter

## 2024-05-07 ENCOUNTER — Encounter

## 2024-05-09 ENCOUNTER — Encounter

## 2024-05-21 NOTE — Progress Notes (Unsigned)
 Cardiology Clinic Note   Date: 05/21/2024 ID: Leslie Shepard, DOB Dec 04, 1960, MRN [id number]   HeartCare Providers Cardiologist:  Evalene Lunger, MD Electrophysiologist:  OLE ONEIDA HOLTS, MD   Chief Complaint   Leslie Shepard is a 63 y.o. female who presents to the clinic today for ***  Patient Profile   Leslie Shepard is followed by Dr. Gollan and Dr. HOLTS for the history outlined below.       Past medical history significant for: CAD. LHC 01/10/2024 (STEMI): Severe single-vessel CAD with thrombotic occlusion of proximal LAD.  Mild nonobstructive disease involving the proximal LCx and mid RCA.  Myocardial bridging of the distal LAD noted.  PCI with DES 2.75 x 18 mm to proximal LAD.  Severely reduced LV function with mid/apical hypokinesis/akinesis, EF 25 to 35%.  Mildly elevated LVEDP. Cardiac arrest/Torsades de point. Chronic HFrEF. Echo 01/10/2024: EF 35 to 40%.  Mild asymmetric LVH of the basal-septal segment.  Grade II DD.  Low normal RV function/normal RV size.  Normal PA pressure.  Mild MR. Cardiac MRI 03/02/2024: Normal left ventricular size and systolic function, LVEF 56%.  Evidence of prior LAD infarct with perfusion defect, wall motion abnormality, and delayed enhancement in the mid anteroseptal and anterior wall, and apical anterior, septal, and inferior walls with nonviable myocardium.  Normal right ventricular chamber size and function, RVEF 66%.  No hemodynamically significant valvular heart disease.  Small circumferential pericardial effusion. Hyperlipidemia. Lipid panel 01/11/2024: LDL 155, HDL 60, TG 68, total 229. LPa 01/11/2024: 105.6.  In summary, patient was initially evaluated by Dr. Verlin on 10/17/2023 for chest pain.  EKG November 2024 demonstrated sinus rhythm with T wave abnormality.  She reported chest pressure at peak exertion with associated dyspnea.  Symptoms resolved with rest.  She was scheduled for coronary CTA and echo.  Testing was  never pursued as patient's symptoms improved.    Patient presented to the ED on 01/10/2024 with midsternal chest pain radiating across chest and down bilateral arms.  She reported sitting made it worse with standing and walking improved symptoms.  EKG concerning for STEMI.  She reported a 10 to 12-day history of recurrent exertional chest pain sometimes lasting up to an hour and decreasing with rest.  On the day of arrival she reported chest pain described as pressure worsening when bending forward or raising her arms.  Pain somewhat improved with aspirin  and SL NTG.  Initial labs: WBC 6.2, hemoglobin 13.4, sodium 136, potassium 3.6, creatinine 0.72, BUN 15.  Troponin 64>> 310>> 24,000.  Chest x-ray with no active cardiopulmonary disease.  Patient was taken emergently to the Cath Lab and underwent PCI with DES to proximal LAD as detailed above.  Echo demonstrated EF 35 to 40% as detailed above.  Postcatheterization patient developed a hematoma on right wrist cath site with TR band still in place.  Manual pressure was held for 10 minutes with no improvement.  Manual BP cuff applied and maintained for 15 minutes with improved appearance.  Hospital course further complicated by V-fib arrest requiring CPR and defibrillation x 1 before ROSC was achieved.  Amiodarone  drip was implemented shortly thereafter patient went into torsades with altered mental status.  While team was preparing to shock patient she converted to sinus rhythm and regained alertness.  2 g magnesium  sulfate administered.  Patient again went into what appeared to be torsades and was defibrillated returning to sinus rhythm.  IV lidocaine  was started.  Patient was evaluated by EP with  plan to remain on IV amiodarone  and lidocaine  for 24 hours.  If patient remains stable will transition lidocaine  to p.o. mexiletine and oral amiodarone .  Patient discharged with LifeVest and scheduled for an outpatient cardiac MRI.  Patient was discharged on 01/14/2024.    She was seen in the office for hospital follow up and was doing well. She had no cardiac complaints at that time. She was eager to start cardiac rehab. Losartan  was added.  She underwent cardiac MRI April 2025 which showed normal LV systolic function as detailed above.  Patient was last seen in the office by Dr. Cindie on 03/07/2024.  Given recovered EF she was felt safe to discontinue LifeVest.  Amiodarone  was reduced to 100 mg daily x 6 weeks before discontinuing.  Plan to discontinue mexiletine at EP follow-up in 3 months.  Patient contacted the office in May with reports of soft BP.  BP ranging from 100-122/60-80.  She was instructed to hold losartan  and continue to monitor BP at home.     History of Present Illness    Today, patient ***  CAD S/p PCI with DES to proximal LAD in the setting of STEMI February 2025.  Patient*** -Continue aspirin , Effient , Toprol , Repatha , Zetia .   Chronic HFrEF Echo February 2025 showed EF 35 to 40%, LVH, Grade II DD, low normal RV function/normal RV size, normal PA pressure, mild MR. Cardiac MRI April 2025 showed normal LV size/function, evidence of prior LAD infarct with perfusion defect and wall motion abnormalities, normal RV size/function, no significant valvular abnormalities, small circumferential pericardial effusion.  Patient ***. Euvolemic and well compensated on exam. -Continue Toprol , Jardiance .   Cardiac arrest/torsades de point Patient experienced several episodes of torsades de point post successful PCI requiring CPR and defibrillation February 2025.  Patient received IV amiodarone  and lidocaine  and was transitioned to mexiletine and p.o. amiodarone .  She was discharged from the hospital on LifeVest.  Patient denies lightheadedness, dizziness, presyncope, syncope.  LifeVest was discontinued in late April 2025. -Continue management with EP.   Hyperlipidemia LDL February 2025 155, not at goal.  Patient has a history of myalgias on high  intensity statin.*** -Continue Repatha  and Zetia . -Repeat lipid panel and LFTs at follow up.***  ROS: All other systems reviewed and are otherwise negative except as noted in History of Present Illness.  EKGs/Labs Reviewed        02/06/2024: ALT 19; AST 15 02/24/2024: BUN 15; Creatinine, Ser 1.00; Potassium 4.3; Sodium 142   02/06/2024: Hemoglobin 13.4; WBC 5.0   10/04/2023: TSH 2.18   No results found for requested labs within last 365 days.  ***  Risk Assessment/Calculations    {Does this patient have ATRIAL FIBRILLATION?:[phone]} No BP recorded.  {Refresh Note OR Click here to enter BP  :1}***        Physical Exam    VS:  There were no vitals taken for this visit. , BMI There is no height or weight on file to calculate BMI.  GEN: Well nourished, well developed, in no acute distress. Neck: No JVD or carotid bruits. Cardiac: *** RRR. *** No murmur. No rubs or gallops.   Respiratory:  Respirations regular and unlabored. Clear to auscultation without rales, wheezing or rhonchi. GI: Soft, nontender, nondistended. Extremities: Radials/DP/PT 2+ and equal bilaterally. No clubbing or cyanosis. No edema ***  Skin: Warm and dry, no rash. Neuro: Strength intact.  Assessment & Plan   ***  Disposition: ***     {Are you ordering a CV Procedure (  e.g. stress test, cath, DCCV, TEE, etc)?   Press F2        :[id number]}   Signed, Barnie HERO. Shanina Kepple, DNP, NP-C

## 2024-05-24 ENCOUNTER — Ambulatory Visit: Attending: Student | Admitting: Student

## 2024-05-24 ENCOUNTER — Encounter: Payer: Self-pay | Admitting: Student

## 2024-05-24 VITALS — BP 122/82 | HR 61 | Ht 64.0 in | Wt 124.2 lb

## 2024-05-24 DIAGNOSIS — I4721 Torsades de pointes: Secondary | ICD-10-CM | POA: Diagnosis not present

## 2024-05-24 DIAGNOSIS — I469 Cardiac arrest, cause unspecified: Secondary | ICD-10-CM

## 2024-05-24 DIAGNOSIS — I5032 Chronic diastolic (congestive) heart failure: Secondary | ICD-10-CM

## 2024-05-24 DIAGNOSIS — E785 Hyperlipidemia, unspecified: Secondary | ICD-10-CM | POA: Diagnosis not present

## 2024-05-24 DIAGNOSIS — I2510 Atherosclerotic heart disease of native coronary artery without angina pectoris: Secondary | ICD-10-CM | POA: Diagnosis not present

## 2024-05-24 NOTE — Patient Instructions (Addendum)
 Medication Instructions:   Your physician recommends that you continue on your current medications as directed. Please refer to the Current Medication list given to you today.   *If you need a refill on your cardiac medications before your next appointment, please call your pharmacy*  Lab Work:  No labs ordered today  If you have labs (blood work) drawn today and your tests are completely normal, you will receive your results only by: MyChart Message (if you have MyChart) OR A paper copy in the mail If you have any lab test that is abnormal or we need to change your treatment, we will call you to review the results.  Testing/Procedures: Your physician has requested that you have an echocardiogram. Echocardiography is a painless test that uses sound waves to create images of your heart. It provides your doctor with information about the size and shape of your heart and how well your heart's chambers and valves are working.   You may receive an ultrasound enhancing agent through an IV if needed to better visualize your heart during the echo. This procedure takes approximately one hour.  There are no restrictions for this procedure.  This will take place at [address] [facility], [place] [address]  Please note: We ask at that you not bring children with you during ultrasound (echo/ vascular) testing. Due to room size and safety concerns, children are not allowed in the ultrasound rooms during exams. Our front office staff cannot provide observation of children in our lobby area while testing is being conducted. An adult accompanying a patient to their appointment will only be allowed in the ultrasound room at the discretion of the ultrasound technician under special circumstances. We apologize for any inconvenience.   Follow-Up: At [facility], you and your health needs are our priority.  As part of our continuing mission to provide you with exceptional  heart care, our providers are all part of one team.  This team includes your primary Cardiologist (physician) and Advanced Practice Providers or APPs (Physician Assistants and Nurse Practitioners) who all work together to provide you with the care you need, when you need it.  Your next appointment:   ECHOCARDIOGRAM in December with Follow Up after Procedure (also December)  Provider:   You may see Timothy Gollan, MD or one of the following Advanced Practice Providers on your designated Care Team:    Barnie Hila, NP

## 2024-06-06 DIAGNOSIS — F4323 Adjustment disorder with mixed anxiety and depressed mood: Secondary | ICD-10-CM | POA: Diagnosis not present

## 2024-06-07 ENCOUNTER — Encounter: Payer: Self-pay | Admitting: Cardiology

## 2024-06-07 DIAGNOSIS — I5032 Chronic diastolic (congestive) heart failure: Secondary | ICD-10-CM | POA: Insufficient documentation

## 2024-06-07 NOTE — Progress Notes (Unsigned)
 Electrophysiology Clinic Note    Date:  06/08/2024  Patient ID:  Leslie Shepard [birth year], MRN [id number] PCP:  Trudy Dorn BRAVO, MD  Cardiologist:  Evalene Lunger, MD   Cardiology APP:  Loistine Sober, NP  Electrophysiologist:  OLE ONEIDA HOLTS, MD  Electrophysiology APP:  Cozetta Seif, NP     Discussed the use of AI scribe software for clinical note transcription with the patient, who gave verbal consent to proceed.   Patient Profile    Chief Complaint: VT follow-up  History of Present Illness: Leslie Shepard is a 63 y.o. female with PMH notable for CAD s/p PCI, VT post revascularization, HFimpEF, HLD; seen today for OLE ONEIDA HOLTS, MD for routine electrophysiology followup.   She had STEMI 12/2023 with PCI placed.  Shortly after heart catheterization she had several episodes of VT/VF requiring CPR and defibrillation.  Amiodarone  and lidocaine  were both started.  She had an updated echo after these episodes that showed a severely reduced ejection fraction.  She was discharged with LifeVest, and on amiodarone  and mexiletine.  She had a cardiac MRI to reevaluate her LVEF in 02/2024 that showed normalization of EF. She last saw Dr. HOLTS 02/2024 where LifeVest was discontinued.  Her Amio was reduced at that visit to 100 mg daily X 6 weeks then plan to stop.  She saw NP W[place] earlier this month where she was doing very well, very active lifestyle without palpitations or chest pain. No bleeding concerns.   On follow-up today, she continues to be quite active. She has noticed some slight SOB with hiking uphill, but is able to attend spin class multiple times a week without any SOB. She has not had any palpitations, chest pain, chest pressure since stopping amiodarone . She is hopeful to stop mexiletine today.      Arrhythmia/Device History Amiodarone  - briefly 2025 after NSTEMI Mexiletine     ROS:  Please see the history of present illness. All other  systems are reviewed and otherwise negative.    Physical Exam    VS:  BP 122/80 (BP Location: Left Arm, Patient Position: Sitting, Cuff Size: Normal)   Pulse 69   Ht 5' 4 (1.626 m)   Wt 128 lb 9.6 oz (58.3 kg)   SpO2 98%   BMI 22.07 kg/m  BMI: Body mass index is 22.07 kg/m.      Wt Readings from Last 3 Encounters:  06/08/24 128 lb 9.6 oz (58.3 kg)  05/24/24 124 lb 3.2 oz (56.3 kg)  04/02/24 126 lb 4.8 oz (57.3 kg)     GEN- The patient is well appearing, alert and oriented x 3 today.   Lungs- Clear to ausculation bilaterally, normal work of breathing.  Heart- Regular rate and rhythm, no murmurs, rubs or gallops Extremities- No peripheral edema, warm, dry  Studies Reviewed   Previous EP, cardiology notes.    EKG is ordered. Personal review of EKG from today shows:    EKG Interpretation Date/Time:  Friday June 08 2024 09:11:50 EDT Ventricular Rate:  69 PR Interval:  148 QRS Duration:  80 QT Interval:  396 QTC Calculation: 424 R Axis:   -1  Text Interpretation: Normal sinus rhythm Low voltage QRS Inferior infarct , age undetermined Confirmed by Tyannah Sane [phone]) on 06/08/2024 9:13:44 AM    Cardiac MRI, 03/02/2024 1. Normal left ventricular size and systolic function, LVEF 56%. Evidence of prior LAD infarct with perfusion defect, wall motion abnormality, and delayed enhancement in the mid-anteroseptal  and anterior wall, and apical anterior, septal, and inferior walls, with nonviable myocardium.   2.  Normal right ventricular chamber size and function, RVEF 66%. 3.  No hemodynamically significant valvular heart disease. 4.  Small circumferential pericardial effusion.  TTE, 01/10/2024  1. Left ventricular ejection fraction, by estimation, is 35 to 40%. The left ventricle has moderately decreased function. The left ventricle demonstrates regional wall motion abnormalities (see scoring diagram/findings for description). There is mild asymmetric left ventricular  hypertrophy of the basal-septal segment. Left ventricular diastolic parameters are consistent with Grade II diastolic dysfunction (pseudonormalization).   2. Right ventricular systolic function is low normal. The right ventricular size is normal. There is normal pulmonary artery systolic pressure.   3. The mitral valve is normal in structure. Mild mitral valve regurgitation. No evidence of mitral stenosis.   4. The aortic valve is tricuspid. Aortic valve regurgitation is not visualized. No aortic stenosis is present.   5. The inferior vena cava is normal in size with greater than 50% respiratory variability, suggesting right atrial pressure of 3 mmHg.    Assessment and Plan     #) CAD s/p PCI #) VT/VF #) HFimpEF LVEF has normalized She has stopped amiodarone  with no palpitations, chest pain, chest pressure She has noticed some SOB with hiking outdoors, uphill. Query whether the hot temps and humidity is the cause as she tolerates spin classes without symptoms Will stop mexiletine Continue 12.5mg  toprol       Current medicines are reviewed at length with the patient today.   The patient does not have concerns regarding her medicines.  The following changes were made today:   STOP mexiletine  Labs/ tests ordered today include:  Orders Placed This Encounter  Procedures   EKG 12-Lead     Disposition: Follow up with EP Team  PRN    Signed, Chantal Needle, NP  06/08/24  11:06 AM  Electrophysiology [facility]

## 2024-06-08 ENCOUNTER — Ambulatory Visit: Attending: Cardiology | Admitting: Cardiology

## 2024-06-08 VITALS — BP 122/80 | HR 69 | Ht 64.0 in | Wt 128.6 lb

## 2024-06-08 DIAGNOSIS — I5032 Chronic diastolic (congestive) heart failure: Secondary | ICD-10-CM | POA: Diagnosis not present

## 2024-06-08 DIAGNOSIS — I2119 ST elevation (STEMI) myocardial infarction involving other coronary artery of inferior wall: Secondary | ICD-10-CM | POA: Diagnosis not present

## 2024-06-08 DIAGNOSIS — I472 Ventricular tachycardia, unspecified: Secondary | ICD-10-CM

## 2024-06-08 NOTE — Patient Instructions (Signed)
 Medication Instructions:  STOP Mexiletine   *If you need a refill on your cardiac medications before your next appointment, please call your pharmacy*  Follow-Up: At [facility], you and your health needs are our priority.  As part of our continuing mission to provide you with exceptional heart care, our providers are all part of one team.  This team includes your primary Cardiologist (physician) and Advanced Practice Providers or APPs (Physician Assistants and Nurse Practitioners) who all work together to provide you with the care you need, when you need it.  Your next appointment:   As needed  Provider:   Ole Holts, MD or Suzann Riddle, NP    We recommend signing up for the patient portal called MyChart.  Sign up information is provided on this After Visit Summary.  MyChart is used to connect with patients for Virtual Visits (Telemedicine).  Patients are able to view lab/test results, encounter notes, upcoming appointments, etc.  Non-urgent messages can be sent to your provider as well.   To learn more about what you can do with MyChart, go to [email or web address].

## 2024-06-21 DIAGNOSIS — F411 Generalized anxiety disorder: Secondary | ICD-10-CM | POA: Diagnosis not present

## 2024-07-19 DIAGNOSIS — F411 Generalized anxiety disorder: Secondary | ICD-10-CM | POA: Diagnosis not present

## 2024-08-09 DIAGNOSIS — F4323 Adjustment disorder with mixed anxiety and depressed mood: Secondary | ICD-10-CM | POA: Diagnosis not present

## 2024-09-10 DIAGNOSIS — F4323 Adjustment disorder with mixed anxiety and depressed mood: Secondary | ICD-10-CM | POA: Diagnosis not present

## 2024-10-09 DIAGNOSIS — F4323 Adjustment disorder with mixed anxiety and depressed mood: Secondary | ICD-10-CM | POA: Diagnosis not present

## 2024-10-22 ENCOUNTER — Other Ambulatory Visit

## 2024-10-26 ENCOUNTER — Ambulatory Visit: Admitting: Cardiovascular Disease

## 2024-10-31 ENCOUNTER — Ambulatory Visit: Attending: Student

## 2024-10-31 DIAGNOSIS — I5020 Unspecified systolic (congestive) heart failure: Secondary | ICD-10-CM | POA: Diagnosis not present

## 2024-10-31 LAB — ECHOCARDIOGRAM COMPLETE
AR max vel: 2.83 cm2
AV Area VTI: 2.62 cm2
AV Area mean vel: 2.71 cm2
AV Mean grad: 5 mmHg
AV Peak grad: 8.9 mmHg
Ao pk vel: 1.49 m/s
S' Lateral: 2.5 cm

## 2024-11-01 ENCOUNTER — Ambulatory Visit: Payer: Self-pay | Admitting: Student

## 2024-11-01 NOTE — Progress Notes (Signed)
 Last read by Faith CHRISTELLA Marrianne Lavada at 10:42AM on 11/01/2024.

## 2024-11-12 ENCOUNTER — Ambulatory Visit: Admitting: Cardiovascular Disease
